# Patient Record
Sex: Male | Born: 1990 | Race: White | Hispanic: No | Marital: Married | Smoking: Never smoker
Health system: Southern US, Community
[De-identification: ages and names within clinical notes are randomized; demographics above are authoritative.]

---
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
## 2011-05-10 ENCOUNTER — Emergency Department (INDEPENDENT_AMBULATORY_CARE_PROVIDER_SITE_OTHER): Payer: BC Managed Care – PPO

## 2011-05-10 ENCOUNTER — Encounter (HOSPITAL_COMMUNITY): Payer: Self-pay | Admitting: *Deleted

## 2011-05-10 ENCOUNTER — Emergency Department (INDEPENDENT_AMBULATORY_CARE_PROVIDER_SITE_OTHER)
Admission: EM | Admit: 2011-05-10 | Discharge: 2011-05-10 | Disposition: A | Discharge status: Home / Self Care | Payer: BC Managed Care – PPO | Source: Home / Self Care | Attending: Emergency Medicine | Admitting: Emergency Medicine

## 2011-05-10 DIAGNOSIS — S40019A Contusion of unspecified shoulder, initial encounter: Secondary | ICD-10-CM

## 2011-05-10 MED ORDER — TRAMADOL HCL 50 MG PO TABS: 50.0000 mg | ORAL_TABLET | Freq: Four times a day (QID) | ORAL | Status: AC | PRN

## 2011-05-10 MED ORDER — MELOXICAM 7.5 MG PO TABS: 7.5000 mg | ORAL_TABLET | Freq: Every day | ORAL | Status: AC

## 2011-05-10 NOTE — ED Provider Notes (Addendum)
History     CSN: [id number]  Arrival date & time 05/10/11  1151   First MD Initiated Contact with Patient 05/10/11 1409      Chief Complaint  Patient presents with  . Shoulder Pain    (Consider location/radiation/quality/duration/timing/severity/associated sxs/prior treatment) HPI Comments: Anh sustained an injury to his right shoulder when he was playful pull and collided with another player and this is yard football fell down, since then been complaining of discomfort (points towards right acromioclavicular joint) pain is exacerbated with movement and direct palpation of the area gets worse when he tries to raise his arm points again to acromioclavicular joint.  Denies any numbness or weakness. Has been trying to use over-the-counter Motrin for discomfort.  The history is provided by the patient and the spouse.    History reviewed. No pertinent past medical history.  History reviewed. No pertinent past surgical history.  History reviewed. No pertinent family history.  History  Substance Use Topics  . Smoking status: Not on file  . Smokeless tobacco: Not on file  . Alcohol Use: Not on file      Review of Systems  Constitutional: Negative for fever.  HENT: Negative for neck pain and neck stiffness.   Musculoskeletal: Positive for arthralgias.  Neurological: Negative for weakness and numbness.    Allergies  Review of patient's allergies indicates no known allergies.  Home Medications  No current outpatient prescriptions on file.  BP 144/82  Pulse 68  Temp(Src) 97.8 F (36.6 C) (Oral)  Resp 16  SpO2 100%  Physical Exam  Constitutional: He appears well-developed and well-nourished. No distress.  Neck: Neck supple. No JVD present.  Pulmonary/Chest: No respiratory distress.  Musculoskeletal:       Right shoulder: He exhibits tenderness, bony tenderness and pain. He exhibits normal range of motion, no swelling, no crepitus and no deformity.        Arms: Lymphadenopathy:    He has no cervical adenopathy.  Neurological: He is alert.  Skin: Skin is warm. No rash noted. No erythema.    ED Course  Procedures (including critical care time)  Labs Reviewed - No data to display Dg Shoulder Right  05/10/2011  *RADIOLOGY REPORT*  Clinical Data: Shoulder pain  RIGHT SHOULDER - 2+ VIEW  Comparison: None.  Findings: Three views of the right shoulder submitted.  No acute fracture or subluxation.  No radiopaque foreign body. The glenohumeral joint and acromioclavicular joint are preserved.  IMPRESSION: No acute fracture or subluxation.  Original Report Authenticated By: Natasha Mead, M.D.     No diagnosis found.    MDM  Lorayne Marek, MD 05/10/11 1443  Jimmie Molly, MD 05/10/11 1452

## 2011-05-10 NOTE — ED Notes (Signed)
Fell onto right shoulder 2 wks ago while playing football, co pain in shoulder and some into right upper arm, working makes pain worse, painful when raising arm

## 2014-08-12 ENCOUNTER — Emergency Department (INDEPENDENT_AMBULATORY_CARE_PROVIDER_SITE_OTHER)
Admission: EM | Admit: 2014-08-12 | Discharge: 2014-08-12 | Disposition: A | Discharge status: Home / Self Care | Payer: Managed Care, Other (non HMO) | Source: Home / Self Care | Attending: Emergency Medicine | Admitting: Emergency Medicine

## 2014-08-12 DIAGNOSIS — S29012A Strain of muscle and tendon of back wall of thorax, initial encounter: Secondary | ICD-10-CM

## 2014-08-12 MED ORDER — TRAMADOL HCL 50 MG PO TABS: 50.0000 mg | ORAL_TABLET | Freq: Four times a day (QID) | ORAL | Status: DC | PRN

## 2014-08-12 MED ORDER — CYCLOBENZAPRINE HCL 10 MG PO TABS
10.0000 mg | ORAL_TABLET | Freq: Three times a day (TID) | ORAL | Status: DC | PRN
Start: 2014-08-12 — End: 2022-05-30

## 2014-08-12 MED ORDER — IBUPROFEN 800 MG PO TABS: 800.0000 mg | ORAL_TABLET | Freq: Three times a day (TID) | ORAL | Status: DC

## 2014-08-12 NOTE — ED Provider Notes (Signed)
CSN: [id number]     Arrival date & time 08/12/14  1515 History   First MD Initiated Contact with Patient 08/12/14 1628     No chief complaint on file. CC: back pain  (Consider location/radiation/quality/duration/timing/severity/associated sxs/prior Treatment) HPI He is a 24 year old man here for evaluation of mid back pain. He states this started yesterday after helping his wife in the yard. 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 It is in his mid back, worse on the right side. It does not radiate. No numbness, tingling, weakness in any extremity.  He has taken Aleve without much improvement. Pain is worse with forward flexion and rotation.  No past medical history on file. No past surgical history on file. No family history on file. History  Substance Use Topics  . Smoking status: Not on file  . Smokeless tobacco: Not on file  . Alcohol Use: Not on file    Review of Systems As in history of present illness Allergies  Review of patient's allergies indicates no known allergies.  Home Medications   Prior to Admission medications   Medication Sig Start Date End Date Taking? Authorizing Provider  cyclobenzaprine (FLEXERIL) 10 MG tablet Take 1 tablet (10 mg total) by mouth 3 (three) times daily as needed for muscle spasms. 08/12/14   Charm RingsErin J Claborn Janusz, MD  ibuprofen (ADVIL,MOTRIN) 800 MG tablet Take 1 tablet (800 mg total) by mouth 3 (three) times daily. 08/12/14   Charm RingsErin J Damarkus Balis, MD  traMADol (ULTRAM) 50 MG tablet Take 1 tablet (50 mg total) by mouth every 6 (six) hours as needed. 08/12/14   Charm RingsErin J Kemari Narez, MD   BP 135/80 mmHg  Pulse 66  Temp(Src) 98 F (36.7 C) (Oral)  Resp 16  SpO2 100% Physical Exam  Constitutional: He is oriented to person, place, and time. He appears well-developed and well-nourished. He appears distressed (sitting stiffly in chair).  Cardiovascular: Normal rate.    Pulmonary/Chest: Effort normal.  Musculoskeletal:  Back: No erythema or edema. No vertebral tenderness or step-offs. He has spasm in the right thoracic paraspinous muscles.  Neurological: He is alert and oriented to person, place, and time.    ED Course  Procedures (including critical care time) Labs Review Labs Reviewed - No data to display  Imaging Review No results found.   MDM   1. Strain of mid-back, initial encounter    Will treat with ice/heat, ibuprofen, Flexeril. Tramadol prescription for severe pain. Work note for [employer] duty provided. Follow-up as needed.    Charm RingsErin J Jumanah Hynson, MD 08/12/14 1710

## 2014-08-12 NOTE — Discharge Instructions (Signed)
You strained one of the muscles in your back. Take ibuprofen 800 mg 3 times a day for the next 3-5 days. Take Flexeril at bedtime for spasm. You can take this medicine up to 3 times a day, but will make you sleepy. Use the tramadol every 6 hours as needed for severe pain. Do not drive while taking this medicine. Alternate ice and heat to your back. Starting Monday, use lotion or essential oils for massage. Follow-up if no improvement in 1 week.

## 2016-03-16 ENCOUNTER — Encounter (HOSPITAL_COMMUNITY): Payer: Self-pay | Admitting: *Deleted

## 2016-03-16 ENCOUNTER — Ambulatory Visit (HOSPITAL_COMMUNITY)
Admission: EM | Admit: 2016-03-16 | Discharge: 2016-03-16 | Disposition: A | Discharge status: Home / Self Care | Payer: BLUE CROSS/BLUE SHIELD | Attending: Family Medicine | Admitting: Family Medicine

## 2016-03-16 DIAGNOSIS — Z87891 Personal history of nicotine dependence: Secondary | ICD-10-CM | POA: Insufficient documentation

## 2016-03-16 DIAGNOSIS — Z202 Contact with and (suspected) exposure to infections with a predominantly sexual mode of transmission: Secondary | ICD-10-CM | POA: Insufficient documentation

## 2016-03-16 MED ORDER — LIDOCAINE HCL (PF) 1 % IJ SOLN
INTRAMUSCULAR | Status: AC
  Filled 2016-03-16: qty 2

## 2016-03-16 MED ORDER — AZITHROMYCIN 250 MG PO TABS
ORAL_TABLET | ORAL | Status: AC
  Filled 2016-03-16: qty 4

## 2016-03-16 MED ORDER — AZITHROMYCIN 250 MG PO TABS
1000.0000 mg | ORAL_TABLET | Freq: Once | ORAL | Status: AC
  Administered 2016-03-16: 1000 mg via ORAL

## 2016-03-16 MED ORDER — CEFTRIAXONE SODIUM 250 MG IJ SOLR
INTRAMUSCULAR | Status: AC
  Filled 2016-03-16: qty 250

## 2016-03-16 MED ORDER — CEFTRIAXONE SODIUM 250 MG IJ SOLR
250.0000 mg | Freq: Once | INTRAMUSCULAR | Status: AC
  Administered 2016-03-16: 250 mg via INTRAMUSCULAR

## 2016-03-16 NOTE — ED Provider Notes (Signed)
[facility]    CSN: [id number] Arrival date & time: 03/16/16  1703     History   Chief Complaint Chief Complaint  Patient presents with  . Exposure to STD    HPI Andrew Cervantes is a [age] y.o. male.   The history is provided by the patient.  Exposure to STD  This is a new problem.  He had sex with someone he knew 6 days ago, later he was informed that his partner has STDs. He denies any penile discharge, no pain, no dysuria, no fever. No previous hx of STD.  History reviewed. No pertinent past medical history.  There are no active problems to display for this patient.   History reviewed. No pertinent surgical history.     Home Medications    Prior to Admission medications   Medication Sig Start Date End Date Taking? Authorizing Provider  cyclobenzaprine (FLEXERIL) 10 MG tablet Take 1 tablet (10 mg total) by mouth 3 (three) times daily as needed for muscle spasms. 08/12/14   Charm RingsErin J Honig, MD  ibuprofen (ADVIL,MOTRIN) 800 MG tablet Take 1 tablet (800 mg total) by mouth 3 (three) times daily. 08/12/14   Charm RingsErin J Honig, MD  traMADol (ULTRAM) 50 MG tablet Take 1 tablet (50 mg total) by mouth every 6 (six) hours as needed. 08/12/14   Charm RingsErin J Honig, MD    Family History History reviewed. No pertinent family history.  Social History Social History  Substance Use Topics  . Smoking status: Former Games developermoker  . Smokeless tobacco: Not on file  . Alcohol use No     Allergies   Patient has no known allergies.   Review of Systems Review of Systems  Constitutional: Negative.   Eyes: Negative.   Respiratory: Negative.   Cardiovascular: Negative.   Gastrointestinal: Negative.   Genitourinary: Negative.   All other systems reviewed and are negative.    Physical Exam Triage Vital Signs ED Triage Vitals [03/16/16 1717]  Enc Vitals Group     BP 135/90     Pulse Rate 71     Resp 16     Temp 97.3 F (36.3 C)     Temp Source Oral     SpO2 100 %     Weight    Height      Head Circumference      Peak Flow      Pain Score      Pain Loc      Pain Edu?      Excl. in GC?    No data found.   Updated Vital Signs BP 135/90   Pulse 71   Temp 97.3 F (36.3 C) (Oral)   Resp 16   SpO2 100%   Visual Acuity Right Eye Distance:   Left Eye Distance:   Bilateral Distance:    Right Eye Near:   Left Eye Near:    Bilateral Near:     Physical Exam  Constitutional: He appears well-developed. No distress.  Cardiovascular: Normal rate, regular rhythm and normal heart sounds.   No murmur heard. Pulmonary/Chest: Effort normal and breath sounds normal. No respiratory distress. He has no wheezes.  Abdominal: Soft. Bowel sounds are normal. He exhibits no distension and no mass. There is no tenderness.  Genitourinary:  Genitourinary Comments: Deferred  Nursing note and vitals reviewed.    UC Treatments / Results  Labs (all labs ordered are listed, but only abnormal results are displayed) Labs Reviewed - No data to display  EKG  EKG Interpretation None       Radiology No results found.  Procedures Procedures (including critical care time)  Medications Ordered in UC Medications - No data to display   Initial Impression / Assessment and Plan / UC Course  I have reviewed the triage vital signs and the nursing notes.  Pertinent labs & imaging results that were available during my care of the patient were reviewed by me and considered in my medical decision making (see chart for details).  Clinical Course as of Mar 16 1728  Wynelle LinkSun Mar 16, 2016  1726 Hx of STD exposure. Patient currently asymptomatic. Will treat based on Hx with Zithromax and Ceftriaxone. Urine cytology obtained for GC/Chlamydia. I will contact him with result if positive. STD counseling done. F/U as needed.  [KE]    Clinical Course User Index [KE] Doreene ElandKehinde T Diamonds Lippard, MD     Final Clinical Impressions(s) / UC Diagnoses   Final diagnoses:  None  STD  exposure    New Prescriptions New Prescriptions   No medications on file     Doreene ElandKehinde T Elajah Kunsman, MD 03/16/16 1735

## 2016-03-16 NOTE — Discharge Instructions (Signed)
It was nice meeting you today. We did some STD screening and we treated you for gonorrhea and chlamydia based on your history. We will contact you with your result. Please use condom regularly. If positive test result, please also inform your partner to get treated.

## 2016-03-16 NOTE — ED Triage Notes (Signed)
Reports having intercourse with partner that was exposed to gonorrhea from another partner.  Pt denies any sxs.

## 2016-03-16 NOTE — ED Notes (Signed)
Verified phone number 

## 2016-03-17 LAB — URINE CYTOLOGY ANCILLARY ONLY
CHLAMYDIA, DNA PROBE: NEGATIVE
NEISSERIA GONORRHEA: NEGATIVE
Trichomonas: NEGATIVE

## 2016-03-17 LAB — RPR: RPR: NONREACTIVE

## 2016-03-17 LAB — HIV ANTIBODY (ROUTINE TESTING W REFLEX): HIV SCREEN 4TH GENERATION: NONREACTIVE

## 2016-03-18 ENCOUNTER — Telehealth: Payer: Self-pay | Admitting: Family Medicine

## 2016-03-18 NOTE — Telephone Encounter (Signed)
I spoke with patient directly. All test results were negative.

## 2017-07-20 ENCOUNTER — Ambulatory Visit: Payer: Self-pay | Admitting: Family Medicine

## 2017-07-20 VITALS — BP 110/76 | HR 88 | Temp 98.9°F | Resp 20 | Wt 181.8 lb

## 2017-07-20 DIAGNOSIS — H6592 Unspecified nonsuppurative otitis media, left ear: Secondary | ICD-10-CM

## 2017-07-20 DIAGNOSIS — H7292 Unspecified perforation of tympanic membrane, left ear: Principal | ICD-10-CM

## 2017-07-20 MED ORDER — AMOXICILLIN-POT CLAVULANATE 875-125 MG PO TABS: 1.0000 | ORAL_TABLET | Freq: Two times a day (BID) | ORAL | 0 refills | Status: DC

## 2017-07-20 NOTE — Patient Instructions (Addendum)
PLAN> F/U with PCP for evaluation  Eardrum Rupture, Adult An eardrum rupture is a hole (perforation) in the eardrum. The eardrum is a thin, round tissue inside of the ear that separates the ear canal from the middle ear. The eardrum is also called the tympanic membrane. It transfers sound vibrations through small bones in the middle ear to the hearing nerve in the inner ear. It also protects the middle ear from germs. An eardrum rupture can cause pain and hearing loss. What are the causes? This condition may be caused by:  An infection.  A sudden injury, such as from: ? Inserting a thin, sharp object into the ear. ? A hit to the side of the head, especially by an open hand. ? Falling onto water or a flat surface. ? A rapid change in pressure, such as from flying or scuba diving. ? A sudden increase in pressure against the eardrum, such as from an explosion or a very loud noise.  Inserting a cotton-tipped swab in the ear.  A long-term eustachian tube disorder. Eustachian tubes are parts of the body that connect each middle ear space to the back of the nose.  A medical procedure or surgery, such as a procedure to remove wax from the ear canal.  Removing a man-made pressure equalization tube(PE tube) that was placed through the eardrum.  Having a PE tube fall out.  What increases the risk? You are more likely to develop this condition if:  You have had PE tubes inserted in your ears.  You have an ear infection.  You play sports that: ? Involve balls or contact with other players. ? Take place in water, such as diving, scuba diving, or waterskiing.  What are the signs or symptoms? Symptoms of this condition include:  Sudden pain at the time of the injury.  Ear pain that suddenly improves.  Ringing in the ear after the injury.  Drainage from the ear. The drainage may be clear, cloudy or pus-like, or bloody.  Hearing loss.  Dizziness.  How is this diagnosed? This  condition is diagnosed based on your symptoms and medical history as well as a physical exam. Your health care provider can usually see a perforation using an ear scope (otoscope). You may have tests, such as:  A hearing test (audiogram) to check for hearing loss.  A test in which a sample of ear drainage is tested for infection (culture).  How is this treated? An eardrum typically heals on its own within a few weeks. If your eardrum does not heal, your health care provider may recommend a procedure to place a patch over your eardrum or surgery to repair your eardrum. Your health care provider may also prescribe antibiotic medicines to help prevent infection. If the ear heals completely, any hearing loss should be temporary. Follow these instructions at home:  Keep your ear dry. This is very important. Follow instructions from your health care provider about how to keep your ear dry. You may need to wear waterproof earplugs when bathing and swimming.  Take over-the-counter and prescription medicines only as told by your health care provider.  Return to sports and activities as told by your health care provider. Ask your health care provider what activities are safe for you.  Wear headgear with ear protection when you play sports in which ear injuries are common.  If directed, apply heat to your affected ear as often as told by your health care provider. Use the heat source that your  health care provider recommends, such as a moist heat pack or a heating pad. This will help to relieve pain. ? Place a towel between your skin and the heat source. ? Leave the heat on for 20-30 minutes. ? Remove the heat if your skin turns bright red. This is especially important if you are unable to feel pain, heat, or cold. You may have a greater risk of getting burned.  Keep all follow-up visits as told by your health care provider. This is important.  Talk to your health care provider before traveling by  plane. Contact a health care provider if:  You have mucus or blood draining from your ear.  You have a fever.  You have ear pain.  You have hearing loss, dizziness, or ringing in your ear. Get help right away if:  You have sudden hearing loss.  You are very dizzy.  You have severe ear pain.  Your face feels weak or becomes paralyzed. Summary  An eardrum rupture is a hole (perforation) in the eardrum that can cause pain and hearing loss. It is usually caused by a sudden injury to the ear.  The eardrum will likely heal on its own within a few weeks. In some cases, surgery may be necessary.  After the injury, follow instructions from your health care provider about how to keep your ear dry as it heals. This information is not intended to replace advice given to you by your health care provider. Make sure you discuss any questions you have with your health care provider. Document Released: 03/14/2000 Document Revised: 05/23/2016 Document Reviewed: 05/23/2016 Elsevier Interactive Patient Education  [employer].

## 2017-07-20 NOTE — Progress Notes (Signed)
Andrew Cervantes is a 27 y.o. male who presents with left ear pain that has persisted the last week then abruptly stopped last night and began to express bloody drainage from the ear.  Review of Systems  Constitutional: Negative.   HENT: Positive for ear discharge and ear pain.   Eyes: Negative.   Respiratory: Negative.   Cardiovascular: Negative.   Gastrointestinal: Negative.   Genitourinary: Negative.   Musculoskeletal: Negative.   Skin: Negative.   Neurological: Negative.   Endo/Heme/Allergies: Negative.   Psychiatric/Behavioral: Negative.     O: Vitals:   07/20/17 1048  BP: 110/76  Pulse: 88  Resp: 20  Temp: 98.9 F (37.2 C)  SpO2: 95%   Physical Exam  Constitutional: He appears well-developed and well-nourished.  HENT:  Right Ear: Hearing, tympanic membrane, external ear and ear canal normal.  Left Ear: Hearing normal. There is drainage. Tympanic membrane is perforated and erythematous.  Nose: Rhinorrhea present.  Mouth/Throat: Uvula is midline, oropharynx is clear and moist and mucous membranes are normal.  Neck: Normal range of motion. Neck supple.  Cardiovascular: Normal rate.  Pulmonary/Chest: Effort normal and breath sounds normal.  Abdominal: Soft. Bowel sounds are normal.  Musculoskeletal: Normal range of motion.  Lymphadenopathy:    He has no cervical adenopathy.  Neurological: He is alert.    A: 1. Otitis media, serous, tm rupture, left    P: Exam findings, diagnosis etiology and typical treatment, medication use and indications reviewed with patient who agrees with POC and verbalized understanding.  1. Otitis media, serous, tm rupture, left - amoxicillin-clavulanate (AUGMENTIN) 875-125 MG tablet; Take 1 tablet by mouth 2 (two) times daily.

## 2017-07-28 ENCOUNTER — Telehealth: Payer: Self-pay

## 2017-07-28 NOTE — Telephone Encounter (Signed)
Tried to call and follow up with pt but pt phone kept disconnecting.

## 2017-10-29 ENCOUNTER — Emergency Department (HOSPITAL_COMMUNITY)
Admission: EM | Admit: 2017-10-29 | Discharge: 2017-10-29 | Disposition: A | Discharge status: Home / Self Care | Payer: No Typology Code available for payment source | Attending: Emergency Medicine | Admitting: Emergency Medicine

## 2017-10-29 ENCOUNTER — Emergency Department (HOSPITAL_COMMUNITY): Payer: No Typology Code available for payment source

## 2017-10-29 ENCOUNTER — Other Ambulatory Visit: Payer: Self-pay

## 2017-10-29 ENCOUNTER — Encounter (HOSPITAL_COMMUNITY): Payer: Self-pay | Admitting: Emergency Medicine

## 2017-10-29 DIAGNOSIS — S01112A Laceration without foreign body of left eyelid and periocular area, initial encounter: Secondary | ICD-10-CM | POA: Diagnosis not present

## 2017-10-29 DIAGNOSIS — Y939 Activity, unspecified: Secondary | ICD-10-CM | POA: Insufficient documentation

## 2017-10-29 DIAGNOSIS — Y999 Unspecified external cause status: Secondary | ICD-10-CM | POA: Diagnosis not present

## 2017-10-29 DIAGNOSIS — Y929 Unspecified place or not applicable: Secondary | ICD-10-CM | POA: Insufficient documentation

## 2017-10-29 DIAGNOSIS — S0990XA Unspecified injury of head, initial encounter: Secondary | ICD-10-CM | POA: Diagnosis not present

## 2017-10-29 DIAGNOSIS — T1490XA Injury, unspecified, initial encounter: Secondary | ICD-10-CM

## 2017-10-29 DIAGNOSIS — S2231XA Fracture of one rib, right side, initial encounter for closed fracture: Secondary | ICD-10-CM | POA: Diagnosis not present

## 2017-10-29 DIAGNOSIS — S0181XA Laceration without foreign body of other part of head, initial encounter: Secondary | ICD-10-CM | POA: Insufficient documentation

## 2017-10-29 DIAGNOSIS — S12390A Other displaced fracture of fourth cervical vertebra, initial encounter for closed fracture: Secondary | ICD-10-CM | POA: Diagnosis not present

## 2017-10-29 DIAGNOSIS — S199XXA Unspecified injury of neck, initial encounter: Secondary | ICD-10-CM | POA: Diagnosis present

## 2017-10-29 DIAGNOSIS — S12490A Other displaced fracture of fifth cervical vertebra, initial encounter for closed fracture: Secondary | ICD-10-CM | POA: Insufficient documentation

## 2017-10-29 DIAGNOSIS — S3991XA Unspecified injury of abdomen, initial encounter: Secondary | ICD-10-CM | POA: Diagnosis not present

## 2017-10-29 LAB — URINALYSIS, ROUTINE W REFLEX MICROSCOPIC
BILIRUBIN URINE: NEGATIVE
Bacteria, UA: NONE SEEN
Glucose, UA: NEGATIVE mg/dL
Ketones, ur: 20 mg/dL — AB
Leukocytes, UA: NEGATIVE
NITRITE: NEGATIVE
Protein, ur: NEGATIVE mg/dL
pH: 5 (ref 5.0–8.0)

## 2017-10-29 LAB — CBC
HEMATOCRIT: 46.2 % (ref 39.0–52.0)
Hemoglobin: 15.3 g/dL (ref 13.0–17.0)
MCH: 30.3 pg (ref 26.0–34.0)
MCHC: 33.1 g/dL (ref 30.0–36.0)
MCV: 91.5 fL (ref 78.0–100.0)
PLATELETS: 189 10*3/uL (ref 150–400)
RBC: 5.05 MIL/uL (ref 4.22–5.81)
RDW: 12 % (ref 11.5–15.5)
WBC: 8.5 10*3/uL (ref 4.0–10.5)

## 2017-10-29 LAB — I-STAT CHEM 8, ED
BUN: 9 mg/dL (ref 6–20)
CREATININE: 1.3 mg/dL — AB (ref 0.61–1.24)
Calcium, Ion: 1.17 mmol/L (ref 1.15–1.40)
Chloride: 102 mmol/L (ref 98–111)
Glucose, Bld: 135 mg/dL — ABNORMAL HIGH (ref 70–99)
HCT: 46 % (ref 39.0–52.0)
HEMOGLOBIN: 15.6 g/dL (ref 13.0–17.0)
POTASSIUM: 3.3 mmol/L — AB (ref 3.5–5.1)
Sodium: 140 mmol/L (ref 135–145)
TCO2: 24 mmol/L (ref 22–32)

## 2017-10-29 LAB — I-STAT CG4 LACTIC ACID, ED: LACTIC ACID, VENOUS: 1.84 mmol/L (ref 0.5–1.9)

## 2017-10-29 LAB — PROTIME-INR
INR: 1.02
PROTHROMBIN TIME: 13.3 s (ref 11.4–15.2)

## 2017-10-29 LAB — COMPREHENSIVE METABOLIC PANEL
ALT: 27 U/L (ref 0–44)
AST: 26 U/L (ref 15–41)
Albumin: 4.3 g/dL (ref 3.5–5.0)
Alkaline Phosphatase: 72 U/L (ref 38–126)
Anion gap: 8 (ref 5–15)
BILIRUBIN TOTAL: 1 mg/dL (ref 0.3–1.2)
BUN: 8 mg/dL (ref 6–20)
CHLORIDE: 106 mmol/L (ref 98–111)
CO2: 26 mmol/L (ref 22–32)
CREATININE: 1.28 mg/dL — AB (ref 0.61–1.24)
Calcium: 9.2 mg/dL (ref 8.9–10.3)
Glucose, Bld: 142 mg/dL — ABNORMAL HIGH (ref 70–99)
Potassium: 3.3 mmol/L — ABNORMAL LOW (ref 3.5–5.1)
Sodium: 140 mmol/L (ref 135–145)
Total Protein: 6.8 g/dL (ref 6.5–8.1)

## 2017-10-29 LAB — ETHANOL: Alcohol, Ethyl (B): <10 mg/dL (ref <10)

## 2017-10-29 MED ORDER — LIDOCAINE-EPINEPHRINE (PF) 2 %-1:200000 IJ SOLN
20.0000 mL | Freq: Once | INTRAMUSCULAR | Status: AC
  Administered 2017-10-29: 20 mL via INTRADERMAL
  Filled 2017-10-29: qty 20

## 2017-10-29 MED ORDER — ONDANSETRON HCL 4 MG/2ML IJ SOLN
4.0000 mg | Freq: Once | INTRAMUSCULAR | Status: AC
  Administered 2017-10-29: 4 mg via INTRAVENOUS
  Filled 2017-10-29: qty 2

## 2017-10-29 MED ORDER — OXYCODONE HCL 5 MG PO TABS
10.0000 mg | ORAL_TABLET | Freq: Once | ORAL | Status: AC
  Administered 2017-10-29: 10 mg via ORAL
  Filled 2017-10-29: qty 2

## 2017-10-29 MED ORDER — MORPHINE SULFATE 15 MG PO TABS: 15.0000 mg | ORAL_TABLET | ORAL | 0 refills | Status: DC | PRN

## 2017-10-29 MED ORDER — KETOROLAC TROMETHAMINE 15 MG/ML IJ SOLN: 15.0000 mg | Freq: Once | INTRAMUSCULAR | Status: DC

## 2017-10-29 MED ORDER — IOPAMIDOL (ISOVUE-370) INJECTION 76%
INTRAVENOUS | Status: AC
  Filled 2017-10-29: qty 100

## 2017-10-29 MED ORDER — MORPHINE SULFATE (PF) 4 MG/ML IV SOLN
4.0000 mg | Freq: Once | INTRAVENOUS | Status: AC
  Administered 2017-10-29: 4 mg via INTRAVENOUS
  Filled 2017-10-29: qty 1

## 2017-10-29 MED ORDER — KETOROLAC TROMETHAMINE 60 MG/2ML IM SOLN
15.0000 mg | Freq: Once | INTRAMUSCULAR | Status: DC
  Administered 2017-10-29: 15 mg via INTRAMUSCULAR
  Filled 2017-10-29: qty 2

## 2017-10-29 MED ORDER — IOPAMIDOL (ISOVUE-370) INJECTION 76%
INTRAVENOUS | Status: AC
  Administered 2017-10-29: 12:00:00
  Filled 2017-10-29: qty 100

## 2017-10-29 MED ORDER — ACETAMINOPHEN 500 MG PO TABS
1000.0000 mg | ORAL_TABLET | Freq: Once | ORAL | Status: AC
  Administered 2017-10-29: 1000 mg via ORAL
  Filled 2017-10-29: qty 2

## 2017-10-29 NOTE — ED Provider Notes (Signed)
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 sx which are most concerning (e.g., weakness, numbness) that necessitate immediate return. Medications administered to the patient during their visit and any new prescriptions provided to the patient are listed below.  Medications given during this visit Medications  iopamidol (ISOVUE-370) 76 % injection (  Canceled Entry 10/29/17 1100)  ketorolac (TORADOL) 15 MG/ML injection 15 mg (has no administration in time range)  iopamidol (ISOVUE-370) 76 % injection (  Contrast Given 10/29/17 1206)  morphine 4 MG/ML injection 4 mg (4 mg Intravenous Given 10/29/17 1158)  ondansetron (ZOFRAN) injection 4 mg (4 mg Intravenous Given 10/29/17 1155)  lidocaine-EPINEPHrine (XYLOCAINE W/EPI) 2 %-1:200000 (PF) injection 20 mL (20 mLs Intradermal Given 10/29/17 1312)  morphine 4 MG/ML injection 4 mg (4 mg Intravenous Given 10/29/17 1357)  ondansetron (ZOFRAN) injection 4 mg (4 mg Intravenous Given 10/29/17 1356)  acetaminophen (TYLENOL) tablet 1,000 mg (1,000 mg Oral Given 10/29/17 1531)  oxyCODONE (Oxy IR/ROXICODONE) immediate release tablet 10 mg (10 mg Oral Given 10/29/17 1532)     The patient appears reasonably screen and/or stabilized for discharge and I doubt any other medical  condition or other [facility] requiring further screening, evaluation, or treatment in the ED at this time prior to discharge.    Final Clinical Impressions(s) / ED Diagnoses   Final diagnoses:  Other closed displaced fracture of fourth cervical vertebra, initial encounter (HCC)  Other closed displaced fracture of fifth cervical vertebra, initial encounter (HCC)  Closed fracture of one rib of right side, initial encounter    ED Discharge Orders  Ordered    morphine (MSIR) 15 MG tablet  Every 4 hours PRN     10/29/17 1515       Melene Plan, DO 10/29/17 1534

## 2017-10-29 NOTE — ED Notes (Signed)
Pt returned from CT- has full sensation and movement to all extremeties
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
## 2017-10-29 NOTE — Discharge Instructions (Addendum)

## 2017-10-29 NOTE — ED Notes (Signed)
Pt continues to have full sensation to all extremeties

## 2017-10-29 NOTE — ED Triage Notes (Signed)
See trauma chart

## 2017-10-30 ENCOUNTER — Encounter (HOSPITAL_COMMUNITY): Payer: Self-pay | Admitting: *Deleted

## 2017-10-30 LAB — CDS SEROLOGY

## 2017-10-31 LAB — SAMPLE TO BLOOD BANK

## 2018-05-09 ENCOUNTER — Ambulatory Visit: Payer: Self-pay | Admitting: Family Medicine

## 2018-05-09 ENCOUNTER — Encounter: Payer: Self-pay | Admitting: Family Medicine

## 2018-05-09 VITALS — BP 120/88 | HR 98 | Temp 98.4°F | Resp 18 | Wt 175.8 lb

## 2018-05-09 DIAGNOSIS — J019 Acute sinusitis, unspecified: Secondary | ICD-10-CM

## 2018-05-09 DIAGNOSIS — R05 Cough: Secondary | ICD-10-CM

## 2018-05-09 DIAGNOSIS — R059 Cough, unspecified: Secondary | ICD-10-CM

## 2018-05-09 MED ORDER — AMOXICILLIN-POT CLAVULANATE 875-125 MG PO TABS: 1.0000 | ORAL_TABLET | Freq: Two times a day (BID) | ORAL | 0 refills | Status: DC

## 2018-05-09 MED ORDER — PSEUDOEPH-BROMPHEN-DM 30-2-10 MG/5ML PO SYRP: 10.0000 mL | ORAL_SOLUTION | Freq: Three times a day (TID) | ORAL | 0 refills | Status: DC | PRN

## 2018-05-09 MED ORDER — AZELASTINE HCL 0.1 % NA SOLN: 1.0000 | Freq: Two times a day (BID) | NASAL | 0 refills | Status: DC

## 2018-05-09 NOTE — Patient Instructions (Signed)
Sinusitis, Adult  Sinusitis is inflammation of your sinuses. Sinuses are hollow spaces in the bones around your face. Your sinuses are located:   Around your eyes.   In the middle of your forehead.   Behind your nose.   In your cheekbones.  Mucus normally drains out of your sinuses. When your nasal tissues become inflamed or swollen, mucus can become trapped or blocked. This allows bacteria, viruses, and fungi to grow, which leads to infection. Most infections of the sinuses are caused by a virus.  Sinusitis can develop quickly. It can last for up to 4 weeks (acute) or for more than 12 weeks (chronic). Sinusitis often develops after a cold.  What are the causes?  This condition is caused by anything that creates swelling in the sinuses or stops mucus from draining. This includes:   Allergies.   Asthma.   Infection from bacteria or viruses.   Deformities or blockages in your nose or sinuses.   Abnormal growths in the nose (nasal polyps).   Pollutants, such as chemicals or irritants in the air.   Infection from fungi (rare).  What increases the risk?  You are more likely to develop this condition if you:   Have a weak body defense system (immune system).   Do a lot of swimming or diving.   Overuse nasal sprays.   Smoke.  What are the signs or symptoms?  The main symptoms of this condition are pain and a feeling of pressure around the affected sinuses. Other symptoms include:   Stuffy nose or congestion.   Thick drainage from your nose.   Swelling and warmth over the affected sinuses.   Headache.   Upper toothache.   A cough that may get worse at night.   Extra mucus that collects in the throat or the back of the nose (postnasal drip).   Decreased sense of smell and taste.   Fatigue.   A fever.   Sore throat.   Bad breath.  How is this diagnosed?  This condition is diagnosed based on:   Your symptoms.   Your medical history.   A physical exam.   Tests to find out if your condition is  acute or chronic. This may include:  ? Checking your nose for nasal polyps.  ? Viewing your sinuses using a device that has a light (endoscope).  ? Testing for allergies or bacteria.  ? Imaging tests, such as an MRI or CT scan.  In rare cases, a bone biopsy may be done to rule out more serious types of fungal sinus disease.  How is this treated?  Treatment for sinusitis depends on the cause and whether your condition is chronic or acute.   If caused by a virus, your symptoms should go away on their own within 10 days. You may be given medicines to relieve symptoms. They include:  ? Medicines that shrink swollen nasal passages (topical intranasal decongestants).  ? Medicines that treat allergies (antihistamines).  ? A spray that eases inflammation of the nostrils (topical intranasal corticosteroids).  ? Rinses that help get rid of thick mucus in your nose (nasal saline washes).   If caused by bacteria, your health care provider may recommend waiting to see if your symptoms improve. Most bacterial infections will get better without antibiotic medicine. You may be given antibiotics if you have:  ? A severe infection.  ? A weak immune system.   If caused by narrow nasal passages or nasal polyps, you may need   to have surgery.  Follow these instructions at home:  Medicines   Take, use, or apply over-the-counter and prescription medicines only as told by your health care provider. These may include nasal sprays.   If you were prescribed an antibiotic medicine, take it as told by your health care provider. Do not stop taking the antibiotic even if you start to feel better.  Hydrate and humidify     Drink enough fluid to keep your urine pale yellow. Staying hydrated will help to thin your mucus.   Use a cool mist humidifier to keep the humidity level in your home above 50%.   Inhale steam for 10-15 minutes, 3-4 times a day, or as told by your health care provider. You can do this in the bathroom while a hot shower is  running.   Limit your exposure to cool or dry air.  Rest   Rest as much as possible.   Sleep with your head raised (elevated).   Make sure you get enough sleep each night.  General instructions     Apply a warm, moist washcloth to your face 3-4 times a day or as told by your health care provider. This will help with discomfort.   Wash your hands often with soap and water to reduce your exposure to germs. If soap and water are not available, use hand sanitizer.   Do not smoke. Avoid being around people who are smoking (secondhand smoke).   Keep all follow-up visits as told by your health care provider. This is important.  Contact a health care provider if:   You have a fever.   Your symptoms get worse.   Your symptoms do not improve within 10 days.  Get help right away if:   You have a severe headache.   You have persistent vomiting.   You have severe pain or swelling around your face or eyes.   You have vision problems.   You develop confusion.   Your neck is stiff.   You have trouble breathing.  Summary   Sinusitis is soreness and inflammation of your sinuses. Sinuses are hollow spaces in the bones around your face.   This condition is caused by nasal tissues that become inflamed or swollen. The swelling traps or blocks the flow of mucus. This allows bacteria, viruses, and fungi to grow, which leads to infection.   If you were prescribed an antibiotic medicine, take it as told by your health care provider. Do not stop taking the antibiotic even if you start to feel better.   Keep all follow-up visits as told by your health care provider. This is important.  This information is not intended to replace advice given to you by your health care provider. Make sure you discuss any questions you have with your health care provider.  Document Released: 03/17/2005 Document Revised: 08/17/2017 Document Reviewed: 08/17/2017  Elsevier Interactive Patient Education  2019 [employer]
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
## 2018-10-20 ENCOUNTER — Other Ambulatory Visit: Payer: Self-pay

## 2018-10-20 ENCOUNTER — Ambulatory Visit (HOSPITAL_COMMUNITY)
Admission: EM | Admit: 2018-10-20 | Discharge: 2018-10-20 | Disposition: A | Discharge status: Home / Self Care | Payer: Self-pay | Attending: Emergency Medicine | Admitting: Emergency Medicine

## 2018-10-20 ENCOUNTER — Encounter (HOSPITAL_COMMUNITY): Payer: Self-pay

## 2018-10-20 DIAGNOSIS — Z20828 Contact with and (suspected) exposure to other viral communicable diseases: Secondary | ICD-10-CM | POA: Insufficient documentation

## 2018-10-20 DIAGNOSIS — R1084 Generalized abdominal pain: Secondary | ICD-10-CM | POA: Insufficient documentation

## 2018-10-20 LAB — POCT URINALYSIS DIP (DEVICE)
Bilirubin Urine: NEGATIVE
Glucose, UA: NEGATIVE mg/dL
Hgb urine dipstick: NEGATIVE
Ketones, ur: NEGATIVE mg/dL
Leukocytes,Ua: NEGATIVE
Nitrite: NEGATIVE
Protein, ur: NEGATIVE mg/dL
Specific Gravity, Urine: 1.02 (ref 1.005–1.030)
Urobilinogen, UA: 1 mg/dL (ref 0.0–1.0)
pH: 7.5 (ref 5.0–8.0)

## 2018-10-20 MED ORDER — ALBENDAZOLE 200 MG PO TABS: 400.0000 mg | ORAL_TABLET | ORAL | 0 refills | Status: DC

## 2018-10-20 NOTE — ED Notes (Signed)
Sent patient to bathroom for urine and stool sample with instruction

## 2018-10-20 NOTE — Discharge Instructions (Addendum)
Take the medicine to treat for pinworms as directed: 2 tablets today on an empty stomach and then repeat in 2 weeks.    Your urine was normal today.    Your stool test are pending.  We will call you if anything comes back positive requiring additional treatment.    Go to the emergency department if your abdominal pain gets acutely worse.    Return here or go to the emergency department if you develop fever, chills, cough, shortness of breath, vomiting, difficulty with urination, back pain, penile discharge, testicular pain.    Establish yourself with a primary care provider such as the one listed.

## 2018-10-20 NOTE — ED Provider Notes (Signed)
[facility]    CSN: [id number] Arrival date & time: 10/20/18  1326     History   Chief Complaint Chief Complaint  Patient presents with   Abdominal Pain   Nausea    HPI Andrew Cervantes is a 28 y.o. male.   Patient presents with 1 year history of intermittent generalized abdominal pain and diarrhea.  He reports the pain is "sharp", occurs mostly at night or in the early morning, and is usually accompanied by diarrhea.  He is here today because he believes he saw a parasite in his stool which he describes as looking like "a piece of spaghetti".  He denies fever, chills, cough, shortness of breath, vomiting, dysuria, back pain, penile discharge, testicular pain.     The history is provided by the patient.    History reviewed. No pertinent past medical history.  There are no active problems to display for this patient.   History reviewed. No pertinent surgical history.     Home Medications    Prior to Admission medications   Medication Sig Start Date End Date Taking? Authorizing Provider  acetaminophen (TYLENOL) 500 MG tablet Take 500 mg by mouth every 6 (six) hours as needed.    [provider]  albendazole (ALBENZA) 200 MG tablet Take 2 tablets (400 mg total) by mouth as directed. Take 2 tablets today.  Then repeat in 2 weeks. 10/20/18   Mickie Bailate, Camren Lipsett H, NP  amoxicillin-clavulanate (AUGMENTIN) 875-125 MG tablet Take 1 tablet by mouth 2 (two) times daily. 05/09/18   Zachery DauerGraham, Elysa, NP  azelastine (ASTELIN) 0.1 % nasal spray Place 1 spray into both nostrils 2 (two) times daily. Use in each nostril as directed 05/09/18   Zachery DauerGraham, Elysa, NP  brompheniramine-pseudoephedrine-DM 30-2-10 MG/5ML syrup Take 10 mLs by mouth 3 (three) times daily as needed. 05/09/18   Zachery DauerGraham, Elysa, NP  cyclobenzaprine (FLEXERIL) 10 MG tablet Take 1 tablet (10 mg total) by mouth 3 (three) times daily as needed for muscle spasms. Patient not taking: Reported on 07/20/2017 08/12/14   Charm RingsHonig,  Erin J, MD  ibuprofen (ADVIL,MOTRIN) 800 MG tablet Take 1 tablet (800 mg total) by mouth 3 (three) times daily. 08/12/14   Charm RingsHonig, Erin J, MD  morphine (MSIR) 15 MG tablet Take 1 tablet (15 mg total) by mouth every 4 (four) hours as needed for severe pain. Patient not taking: Reported on 05/09/2018 10/29/17   Melene PlanFloyd, Dan, DO  traMADol (ULTRAM) 50 MG tablet Take 1 tablet (50 mg total) by mouth every 6 (six) hours as needed. Patient not taking: Reported on 07/20/2017 08/12/14   Charm RingsHonig, Erin J, MD    Family History History reviewed. No pertinent family history.  Social History Social History   Tobacco Use   Smoking status: Never Smoker   Smokeless tobacco: Never Used  Substance Use Topics   Alcohol use: Yes   Drug use: Not Currently    Types: Marijuana     Allergies   Patient has no known allergies.   Review of Systems Review of Systems  Constitutional: Negative for chills and fever.  HENT: Negative for ear pain and sore throat.   Eyes: Negative for pain and visual disturbance.  Respiratory: Negative for cough and shortness of breath.   Cardiovascular: Negative for chest pain and palpitations.  Gastrointestinal: Positive for abdominal pain and diarrhea. Negative for vomiting.  Genitourinary: Negative for discharge, dysuria, flank pain, hematuria, penile pain and testicular pain.  Musculoskeletal: Negative for arthralgias and back pain.  Skin:  Negative for color change and rash.  Neurological: Negative for seizures and syncope.  All other systems reviewed and are negative.    Physical Exam Triage Vital Signs ED Triage Vitals  Enc Vitals Group     BP 10/20/18 1430 105/76     Pulse Rate 10/20/18 1430 78     Resp 10/20/18 1430 18     Temp 10/20/18 1430 98.2 F (36.8 C)     Temp Source 10/20/18 1430 Oral     SpO2 10/20/18 1430 97 %     Weight 10/20/18 1427 175 lb (79.4 kg)     Height --      Head Circumference --      Peak Flow --      Pain Score 10/20/18 1427 3     Pain  Loc --      Pain Edu? --      Excl. in GC? --    No data found.  Updated Vital Signs BP 105/76 (BP Location: Right Arm)    Pulse 78    Temp 98.2 F (36.8 C) (Oral)    Resp 18    Wt 175 lb (79.4 kg)    SpO2 97%    BMI 23.73 kg/m   Visual Acuity Right Eye Distance:   Left Eye Distance:   Bilateral Distance:    Right Eye Near:   Left Eye Near:    Bilateral Near:     Physical Exam Vitals signs and nursing note reviewed.  Constitutional:      Appearance: He is well-developed.  HENT:     Head: Normocephalic and atraumatic.  Eyes:     Conjunctiva/sclera: Conjunctivae normal.  Neck:     Musculoskeletal: Neck supple.  Cardiovascular:     Rate and Rhythm: Normal rate and regular rhythm.  Pulmonary:     Effort: Pulmonary effort is normal. No respiratory distress.     Breath sounds: Normal breath sounds.  Abdominal:     Palpations: Abdomen is soft.     Tenderness: There is no abdominal tenderness. There is no right CVA tenderness, left CVA tenderness, guarding or rebound.  Skin:    General: Skin is warm and dry.  Neurological:     Mental Status: He is alert.      UC Treatments / Results  Labs (all labs ordered are listed, but only abnormal results are displayed) Labs Reviewed  GASTROINTESTINAL PANEL BY PCR, STOOL (REPLACES STOOL CULTURE)  NOVEL CORONAVIRUS, NAA (HOSPITAL ORDER, SEND-OUT TO REF LAB)  POCT URINALYSIS DIP (DEVICE)    EKG   Radiology No results found.  Procedures Procedures (including critical care time)  Medications Ordered in UC Medications - No data to display  Initial Impression / Assessment and Plan / UC Course  I have reviewed the triage vital signs and the nursing notes.  Pertinent labs & imaging results that were available during my care of the patient were reviewed by me and considered in my medical decision making (see chart for details).  Clinical Course as of Oct 19 1541  Wed Oct 20, 2018  1504 POCT Urinalysis, Dipstick [KT]    1510 POCT Urinalysis, Dipstick [KT]  1513 POCT Urinalysis, Dipstick [KT]  1522 POCT Urinalysis, Dipstick [KT]    Clinical Course User Index [KT] Mickie Bailate, Matteo Banke H, NP   Abdominal pain.  Urine negative.  Exam unremarkable.  Stool test pending; treating today with albendazole.  Discussed with patient that we will call him if his stool test indicate additional treatment is  needed.  Instructed patient to go to the emergency department if his abdominal pain gets acutely worse.  Discussed that he can return here or go to the emergency department if he develops fever, chills, cough, shortness of breath, vomiting, dysuria, back pain, penile discharge, testicular pain, or other concerning symptoms.  Instructed patient to establish himself with a primary care provider for additional evaluation as needed.   Final Clinical Impressions(s) / UC Diagnoses   Final diagnoses:  Generalized abdominal pain     Discharge Instructions     Take the medicine to treat for pinworms as directed: 2 tablets today on an empty stomach and then repeat in 2 weeks.    Your urine was normal today.    Your stool test are pending.  We will call you if anything comes back positive requiring additional treatment.    Go to the emergency department if your abdominal pain gets acutely worse.    Return here or go to the emergency department if you develop fever, chills, cough, shortness of breath, vomiting, difficulty with urination, back pain, penile discharge, testicular pain.    Establish yourself with a primary care provider such as the one listed.        ED Prescriptions    Medication Sig Dispense Auth. Provider   albendazole (ALBENZA) 200 MG tablet Take 2 tablets (400 mg total) by mouth as directed. Take 2 tablets today.  Then repeat in 2 weeks. 4 tablet Sharion Balloon, NP     Controlled Substance Prescriptions [place] Controlled Substance Registry consulted? Not Applicable   Sharion Balloon, NP 10/20/18 1545

## 2018-10-20 NOTE — ED Triage Notes (Addendum)
Pt cc abdominal pain, and nauseous. Pt thinks he saw a parasite in his stool. 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 )

## 2018-10-21 ENCOUNTER — Telehealth (HOSPITAL_COMMUNITY): Payer: Self-pay | Admitting: Emergency Medicine

## 2018-10-21 LAB — GASTROINTESTINAL PANEL BY PCR, STOOL (REPLACES STOOL CULTURE)

## 2018-10-21 NOTE — Telephone Encounter (Signed)
Negative, Attempted to reach patient. No answer at this time

## 2018-10-22 LAB — NOVEL CORONAVIRUS, NAA (HOSP ORDER, SEND-OUT TO REF LAB; TAT 18-24 HRS): SARS-CoV-2, NAA: NOT DETECTED

## 2018-10-25 ENCOUNTER — Encounter (HOSPITAL_COMMUNITY): Payer: Self-pay
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
## 2022-03-04 ENCOUNTER — Ambulatory Visit
Admission: EM | Admit: 2022-03-04 | Discharge: 2022-03-04 | Disposition: A | Discharge status: Home / Self Care | Payer: Self-pay | Attending: Physician Assistant | Admitting: Physician Assistant

## 2022-03-04 DIAGNOSIS — K047 Periapical abscess without sinus: Secondary | ICD-10-CM

## 2022-03-04 MED ORDER — IBUPROFEN 800 MG PO TABS: 800.0000 mg | ORAL_TABLET | Freq: Three times a day (TID) | ORAL | 0 refills | Status: DC

## 2022-03-04 MED ORDER — AMOXICILLIN 500 MG PO CAPS: 500.0000 mg | ORAL_CAPSULE | Freq: Three times a day (TID) | ORAL | 0 refills | Status: DC

## 2022-03-04 NOTE — ED Provider Notes (Signed)
[facility]    CSN: [id number] Arrival date & time: 03/04/22  0947      History   Chief Complaint Chief Complaint  Patient presents with   Dental Pain    HPI Dwain Huhn is a 31 y.o. male.   Patient here today for evaluation of left-sided lower dental pain that started recently.  He states that he lost a feeling to his left wisdom tooth and now he has pain that radiates to his ear on that side.  He has tried taking Motrin with mild relief.  His last dose of same was this morning.  He has not had any fever.  He does not currently have a dentist.  The history is provided by the patient.  Dental Pain Associated symptoms: no facial swelling and no fever     History reviewed. No pertinent past medical history.  There are no problems to display for this patient.   History reviewed. No pertinent surgical history.     Home Medications    Prior to Admission medications   Medication Sig Start Date End Date Taking? Authorizing Provider  amoxicillin (AMOXIL) 500 MG capsule Take 1 capsule (500 mg total) by mouth 3 (three) times daily. 03/04/22  Yes Tomi Bamberger, PA-C  ibuprofen (ADVIL) 800 MG tablet Take 1 tablet (800 mg total) by mouth 3 (three) times daily. 03/04/22  Yes Tomi Bamberger, PA-C  acetaminophen (TYLENOL) 500 MG tablet Take 500 mg by mouth every 6 (six) hours as needed.    [provider]  albendazole (ALBENZA) 200 MG tablet Take 2 tablets (400 mg total) by mouth as directed. Take 2 tablets today.  Then repeat in 2 weeks. 10/20/18   Mickie Bail, NP  amoxicillin-clavulanate (AUGMENTIN) 875-125 MG tablet Take 1 tablet by mouth 2 (two) times daily. 05/09/18   Zachery Dauer, NP  azelastine (ASTELIN) 0.1 % nasal spray Place 1 spray into both nostrils 2 (two) times daily. Use in each nostril as directed 05/09/18   Zachery Dauer, NP  brompheniramine-pseudoephedrine-DM 30-2-10 MG/5ML syrup Take 10 mLs by mouth 3 (three) times daily as needed.  05/09/18   Zachery Dauer, NP  cyclobenzaprine (FLEXERIL) 10 MG tablet Take 1 tablet (10 mg total) by mouth 3 (three) times daily as needed for muscle spasms. Patient not taking: Reported on 07/20/2017 08/12/14   Charm Rings, MD  morphine (MSIR) 15 MG tablet Take 1 tablet (15 mg total) by mouth every 4 (four) hours as needed for severe pain. Patient not taking: Reported on 05/09/2018 10/29/17   Melene Plan, DO  traMADol (ULTRAM) 50 MG tablet Take 1 tablet (50 mg total) by mouth every 6 (six) hours as needed. Patient not taking: Reported on 07/20/2017 08/12/14   Charm Rings, MD    Family History History reviewed. No pertinent family history.  Social History Social History   Tobacco Use   Smoking status: Never   Smokeless tobacco: Never  Vaping Use   Vaping Use: Never used  Substance Use Topics   Alcohol use: Yes   Drug use: Not Currently    Types: Marijuana     Allergies   Patient has no known allergies.   Review of Systems Review of Systems  Constitutional:  Negative for chills and fever.  HENT:  Positive for dental problem. Negative for facial swelling.   Eyes:  Negative for discharge and redness.  Respiratory:  Negative for shortness of breath.   Neurological:  Negative for numbness.  Physical Exam Triage Vital Signs ED Triage Vitals  Enc Vitals Group     BP 03/04/22 1016 123/66     Pulse Rate 03/04/22 1016 70     Resp 03/04/22 1016 15     Temp 03/04/22 1016 97.7 F (36.5 C)     Temp src --      SpO2 03/04/22 1016 98 %     Weight --      Height --      Head Circumference --      Peak Flow --      Pain Score 03/04/22 1015 4     Pain Loc --      Pain Edu? --      Excl. in [place]? --    No data found.  Updated Vital Signs BP 123/66   Pulse 70   Temp 97.7 F (36.5 C)   Resp 15   SpO2 98%      Physical Exam Vitals and nursing note reviewed.  Constitutional:      General: He is not in acute distress.    Appearance: Normal appearance. He is not  ill-appearing.  HENT:     Head: Normocephalic and atraumatic.     Nose: No congestion or rhinorrhea.     Mouth/Throat:     Comments: Dental caries noted to left lower posterior molar with gingival swelling Eyes:     Conjunctiva/sclera: Conjunctivae normal.  Cardiovascular:     Rate and Rhythm: Normal rate.  Pulmonary:     Effort: Pulmonary effort is normal.  Neurological:     Mental Status: He is alert.  Psychiatric:        Mood and Affect: Mood normal.        Behavior: Behavior normal.        Thought Content: Thought content normal.      UC Treatments / Results  Labs (all labs ordered are listed, but only abnormal results are displayed) Labs Reviewed - No data to display  EKG   Radiology No results found.  Procedures Procedures (including critical care time)  Medications Ordered in UC Medications - No data to display  Initial Impression / Assessment and Plan / UC Course  I have reviewed the triage vital signs and the nursing notes.  Pertinent labs & imaging results that were available during my care of the patient were reviewed by me and considered in my medical decision making (see chart for details).    Will treat to cover possible dental abscess with amoxicillin and ibuprofen prescribed for pain.  Recommended patient eat before taking such high dose NSAID.  Encouraged follow-up with dentistry and list of more affordable dentists provided to patient today.  Encouraged sooner follow-up with any further concerns.  Final Clinical Impressions(s) / UC Diagnoses   Final diagnoses:  Dental abscess   Discharge Instructions   None    ED Prescriptions     Medication Sig Dispense Auth. Provider   amoxicillin (AMOXIL) 500 MG capsule Take 1 capsule (500 mg total) by mouth 3 (three) times daily. 21 capsule Erma Pinto F, PA-C   ibuprofen (ADVIL) 800 MG tablet Take 1 tablet (800 mg total) by mouth 3 (three) times daily. 21 tablet Tomi Bamberger, PA-C      PDMP  not reviewed this encounter.   Tomi Bamberger, PA-C 03/04/22 1257

## 2022-03-04 NOTE — ED Triage Notes (Signed)
Pt presents to [facility] with co of L sided dental pain. Pt reports he lost a filling on L wisdom tooth and now has pain with otalgia on that side. Pt reports motrin for pain.

## 2022-05-30 ENCOUNTER — Ambulatory Visit
Admission: EM | Admit: 2022-05-30 | Discharge: 2022-05-30 | Disposition: A | Discharge status: Home / Self Care | Payer: 59 | Attending: Family Medicine | Admitting: Family Medicine

## 2022-05-30 DIAGNOSIS — J4521 Mild intermittent asthma with (acute) exacerbation: Secondary | ICD-10-CM | POA: Diagnosis not present

## 2022-05-30 DIAGNOSIS — K029 Dental caries, unspecified: Secondary | ICD-10-CM

## 2022-05-30 DIAGNOSIS — K122 Cellulitis and abscess of mouth: Secondary | ICD-10-CM | POA: Diagnosis not present

## 2022-05-30 MED ORDER — KETOROLAC TROMETHAMINE 30 MG/ML IJ SOLN
30.0000 mg | Freq: Once | INTRAMUSCULAR | Status: AC
  Administered 2022-05-30: 30 mg via INTRAMUSCULAR

## 2022-05-30 MED ORDER — AMOXICILLIN-POT CLAVULANATE 875-125 MG PO TABS: 1.0000 | ORAL_TABLET | Freq: Two times a day (BID) | ORAL | 0 refills | Status: AC

## 2022-05-30 MED ORDER — PREDNISONE 20 MG PO TABS: 40.0000 mg | ORAL_TABLET | Freq: Every day | ORAL | 0 refills | Status: AC

## 2022-05-30 MED ORDER — ALBUTEROL SULFATE HFA 108 (90 BASE) MCG/ACT IN AERS: 2.0000 | INHALATION_SPRAY | RESPIRATORY_TRACT | 0 refills | Status: DC | PRN

## 2022-05-30 NOTE — ED Provider Notes (Signed)
[facility]    CSN: [id number] Arrival date & time: 05/30/22  0919      History   Chief Complaint Chief Complaint  Patient presents with   Dental Pain    HPI Andrew Cervantes is a 33 y.o. male.    Dental Pain  Here for left-sided tooth pain and swelling in his left cheek.  Symptoms began 2 or 3 days ago, but it worsened this morning.  No fever or chills.  It hurts to swallow on the left side.  No new congestion or cough.  He has had cough and congestion in his chest for the last 3 months.  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  He did have asthma when he was a child  History reviewed. No pertinent past medical history.  There are no problems to display for this patient.   History reviewed. No pertinent surgical history.     Home Medications    Prior to Admission medications   Medication Sig Start Date End Date Taking? Authorizing Provider  albuterol (VENTOLIN HFA) 108 (90 Base) MCG/ACT inhaler Inhale 2 puffs into the lungs every 4 (four) hours as needed for wheezing or shortness of breath. 05/30/22  Yes Barrett Henle, MD  amoxicillin-clavulanate (AUGMENTIN) 875-125 MG tablet Take 1 tablet by mouth 2 (two) times daily for 7 days. 05/30/22 06/06/22 Yes Elysia Grand, Gwenlyn Perking, MD  predniSONE (DELTASONE) 20 MG tablet Take 2 tablets (40 mg total) by mouth daily with breakfast for 5 days. 05/30/22 06/04/22 Yes Jasmeet Manton, Gwenlyn Perking, MD  acetaminophen (TYLENOL) 500 MG tablet Take 500 mg by mouth every 6 (six) hours as needed.    [provider]  azelastine (ASTELIN) 0.1 % nasal spray Place 1 spray into both nostrils 2 (two) times daily. Use in each nostril as directed 05/09/18   Shella Maxim, NP    Family History History reviewed. No pertinent family history.  Social History Social History   Tobacco Use   Smoking status: Never   Smokeless tobacco: Never  Vaping Use   Vaping Use: Never used  Substance Use Topics   Alcohol use: Yes    Drug use: Not Currently    Types: Marijuana     Allergies   Patient has no known allergies.   Review of Systems Review of Systems   Physical Exam Triage Vital Signs ED Triage Vitals  Enc Vitals Group     BP 05/30/22 0932 116/70     Pulse Rate 05/30/22 0932 (!) 59     Resp 05/30/22 0932 20     Temp 05/30/22 0932 98 F (36.7 C)     Temp Source 05/30/22 0932 Oral     SpO2 05/30/22 0932 95 %     Weight --      Height --      Head Circumference --      Peak Flow --      Pain Score 05/30/22 0931 8     Pain Loc --      Pain Edu? --      Excl. in [place]? --    No data found.  Updated Vital Signs BP 116/70 (BP Location: Left Arm)   Pulse (!) 59   Temp 98 F (36.7 C) (Oral)   Resp 20   SpO2 95%   Visual Acuity Right Eye Distance:   Left Eye Distance:   Bilateral Distance:    Right Eye Near:   Left Eye Near:  Bilateral Near:     Physical Exam Vitals reviewed.  Constitutional:      General: He is not in acute distress.    Appearance: He is not ill-appearing, toxic-appearing or diaphoretic.  HENT:     Head:     Comments: Some swelling of the left jaw near the angle.  It is mildly tender.  No fluctuance.  No erythema    Right Ear: Tympanic membrane and ear canal normal.     Left Ear: Tympanic membrane and ear canal normal.     Nose: Nose normal.     Mouth/Throat:     Mouth: Mucous membranes are moist.     Pharynx: No oropharyngeal exudate or posterior oropharyngeal erythema.     Comments: There is a carious tooth at the posterior left lower dental ridge.  No sign of an abscess at present Eyes:     Extraocular Movements: Extraocular movements intact.     Conjunctiva/sclera: Conjunctivae normal.     Pupils: Pupils are equal, round, and reactive to light.  Cardiovascular:     Rate and Rhythm: Normal rate and regular rhythm.     Heart sounds: No murmur heard. Pulmonary:     Effort: No respiratory distress.     Breath sounds: No stridor. No rhonchi or rales.      Comments: There are bilateral expiratory wheezes with good air movement. Musculoskeletal:     Cervical back: Neck supple.  Lymphadenopathy:     Cervical: No cervical adenopathy.  Skin:    Capillary Refill: Capillary refill takes less than 2 seconds.     Coloration: Skin is not jaundiced or pale.  Neurological:     General: No focal deficit present.     Mental Status: He is alert and oriented to person, place, and time.  Psychiatric:        Behavior: Behavior normal.      UC Treatments / Results  Labs (all labs ordered are listed, but only abnormal results are displayed) Labs Reviewed - No data to display  EKG   Radiology No results found.  Procedures Procedures (including critical care time)  Medications Ordered in UC Medications  ketorolac (TORADOL) 30 MG/ML injection 30 mg (has no administration in time range)    Initial Impression / Assessment and Plan / UC Course  I have reviewed the triage vital signs and the nursing notes.  Pertinent labs & imaging results that were available during my care of the patient were reviewed by me and considered in my medical decision making (see chart for details).        I am going to treat the oral/dental infection with Augmentin.  I think he is also been having an asthma exacerbation, so albuterol and prednisone are sent in.  He is going to continue the over-the-counter pain relief he has been using, as it has been helping some.  We discussed getting in with a dentist and he is given instructions on how to self schedule with a primary care provider on the [facility] health website   Final Clinical Impressions(s) / UC Diagnoses   Final diagnoses:  Oral infection  Dental caries  Mild intermittent asthma with acute exacerbation     Discharge Instructions      You have been given a shot of Toradol 30 mg today.  Take amoxicillin-clavulanate 875 mg--1 tab twice daily with food for 7 days  Albuterol inhaler--do 2 puffs  every 4 hours as needed for shortness of breath or wheezing  Take  prednisone 20 mg--2 daily for 5 days  You can use the QR code/website at the back of the summary paperwork to schedule yourself a new patient appointment with primary care  It would be good to see a dentist      ED Prescriptions     Medication Sig Dispense Auth. Provider   amoxicillin-clavulanate (AUGMENTIN) 875-125 MG tablet Take 1 tablet by mouth 2 (two) times daily for 7 days. 14 tablet Bleu Minerd, Gwenlyn Perking, MD   albuterol (VENTOLIN HFA) 108 (90 Base) MCG/ACT inhaler Inhale 2 puffs into the lungs every 4 (four) hours as needed for wheezing or shortness of breath. 1 each Barrett Henle, MD   predniSONE (DELTASONE) 20 MG tablet Take 2 tablets (40 mg total) by mouth daily with breakfast for 5 days. 10 tablet Windy Carina Gwenlyn Perking, MD      I have reviewed the PDMP during this encounter.   Barrett Henle, MD 05/30/22 [phone]

## 2022-05-30 NOTE — Discharge Instructions (Addendum)
You have been given a shot of Toradol 30 mg today.  Take amoxicillin-clavulanate 875 mg--1 tab twice daily with food for 7 days  Albuterol inhaler--do 2 puffs every 4 hours as needed for shortness of breath or wheezing  Take prednisone 20 mg--2 daily for 5 days  You can use the QR code/website at the back of the summary paperwork to schedule yourself a new patient appointment with primary care  It would be good to see a dentist

## 2022-05-30 NOTE — ED Triage Notes (Signed)
Pt reports left side back bottom tooth pain that has made his cheek swollen x 3 days ago. His left ear hurts and reports throat pain making it hard to swallow.

## 2023-04-21 DIAGNOSIS — R197 Diarrhea, unspecified: Secondary | ICD-10-CM | POA: Diagnosis not present

## 2023-04-21 DIAGNOSIS — K529 Noninfective gastroenteritis and colitis, unspecified: Secondary | ICD-10-CM | POA: Diagnosis not present

## 2023-04-21 DIAGNOSIS — R112 Nausea with vomiting, unspecified: Secondary | ICD-10-CM | POA: Diagnosis not present

## 2023-04-21 DIAGNOSIS — R1032 Left lower quadrant pain: Secondary | ICD-10-CM | POA: Diagnosis not present

## 2023-09-28 ENCOUNTER — Ambulatory Visit
Admission: EM | Admit: 2023-09-28 | Discharge: 2023-09-28 | Disposition: A | Discharge status: Home / Self Care | Payer: Self-pay | Attending: Nurse Practitioner | Admitting: Nurse Practitioner

## 2023-09-28 DIAGNOSIS — K0889 Other specified disorders of teeth and supporting structures: Secondary | ICD-10-CM

## 2023-09-28 DIAGNOSIS — K047 Periapical abscess without sinus: Secondary | ICD-10-CM

## 2023-09-28 MED ORDER — IBUPROFEN 800 MG PO TABS: 800.0000 mg | ORAL_TABLET | Freq: Three times a day (TID) | ORAL | 0 refills | Status: DC

## 2023-09-28 MED ORDER — AMOXICILLIN-POT CLAVULANATE 875-125 MG PO TABS: 1.0000 | ORAL_TABLET | Freq: Two times a day (BID) | ORAL | 0 refills | Status: DC

## 2023-09-28 MED ORDER — KETOROLAC TROMETHAMINE 30 MG/ML IJ SOLN
30.0000 mg | Freq: Once | INTRAMUSCULAR | Status: AC
  Administered 2023-09-28: 30 mg via INTRAMUSCULAR

## 2023-09-28 NOTE — ED Provider Notes (Signed)
 [facility]    CSN: [id number] Arrival date & time: 09/28/23  1836      History   Chief Complaint No chief complaint on file.   HPI Andrew Cervantes is a 33 y.o. male.   The history is provided by the patient.   Patient presents with a 2-day history of left-sided dental pain and left ear pain.  Patient reports that he does have a questionable tooth that is most likely causing his symptoms.  Denies fever, chills, facial swelling, chest pain, abdominal pain, nausea, vomiting, or diarrhea.  Patient states he has been taking Tylenol  for symptoms with minimal relief.  States pain worsened today while he was at work.  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  History reviewed. No pertinent past medical history.  There are no active problems to display for this patient.   History reviewed. No pertinent surgical history.     Home Medications    Prior to Admission medications   Medication Sig Start Date End Date Taking? Authorizing Provider  amoxicillin -clavulanate (AUGMENTIN ) 875-125 MG tablet Take 1 tablet by mouth every 12 (twelve) hours. 09/28/23  Yes Leath-Warren, Etta PARAS, NP  ibuprofen  (ADVIL ) 800 MG tablet Take 1 tablet (800 mg total) by mouth 3 (three) times daily. 09/28/23  Yes Leath-Warren, Etta PARAS, NP  acetaminophen  (TYLENOL ) 500 MG tablet Take 500 mg by mouth every 6 (six) hours as needed.    [provider]  albuterol  (VENTOLIN  HFA) 108 (90 Base) MCG/ACT inhaler Inhale 2 puffs into the lungs every 4 (four) hours as needed for wheezing or shortness of breath. 05/30/22   Vonna Sharlet POUR, MD  azelastine  (ASTELIN ) 0.1 % nasal spray Place 1 spray into both nostrils 2 (two) times daily. Use in each nostril as directed 05/09/18   Arlyss Rumpf, NP    Family History History reviewed. No pertinent family history.  Social History Social History   Tobacco Use   Smoking status: Never   Smokeless tobacco: Never  Vaping Use   Vaping status:  Never Used  Substance Use Topics   Alcohol use: Yes   Drug use: Not Currently    Types: Marijuana     Allergies   Patient has no known allergies.   Review of Systems Review of Systems Per HPI  Physical Exam Triage Vital Signs ED Triage Vitals  Encounter Vitals Group     BP 09/28/23 1916 135/84     Girls Systolic BP Percentile --      Girls Diastolic BP Percentile --      Boys Systolic BP Percentile --      Boys Diastolic BP Percentile --      Pulse Rate 09/28/23 1916 70     Resp 09/28/23 1916 18     Temp 09/28/23 1916 98.2 F (36.8 C)     Temp Source 09/28/23 1916 Oral     SpO2 09/28/23 1916 96 %     Weight --      Height --      Head Circumference --      Peak Flow --      Pain Score 09/28/23 1917 10     Pain Loc --      Pain Education --      Exclude from Growth Chart --    No data found.  Updated Vital Signs BP 135/84 (BP Location: Right Arm)   Pulse 70   Temp 98.2 F (36.8 C) (Oral)   Resp 18  SpO2 96%   Visual Acuity Right Eye Distance:   Left Eye Distance:   Bilateral Distance:    Right Eye Near:   Left Eye Near:    Bilateral Near:     Physical Exam Vitals and nursing note reviewed.  Constitutional:      General: He is not in acute distress.    Appearance: Normal appearance.  HENT:     Head: Normocephalic.     Mouth/Throat:     Lips: Pink.     Dentition: Abnormal dentition. Dental tenderness, gingival swelling and dental caries present.      Comments: Dental caries noted to left lower posterior molar with gingival swelling  Eyes:     Extraocular Movements: Extraocular movements intact.     Pupils: Pupils are equal, round, and reactive to light.    Cardiovascular:     Rate and Rhythm: Normal rate and regular rhythm.     Pulses: Normal pulses.     Heart sounds: Normal heart sounds.  Pulmonary:     Effort: Pulmonary effort is normal.     Breath sounds: Normal breath sounds.  Abdominal:     Palpations: Abdomen is soft.      Tenderness: There is no abdominal tenderness.   Musculoskeletal:     Cervical back: Normal range of motion.   Skin:    General: Skin is warm and dry.   Neurological:     General: No focal deficit present.     Mental Status: He is alert and oriented to person, place, and time.   Psychiatric:        Mood and Affect: Mood normal.        Behavior: Behavior normal.      UC Treatments / Results  Labs (all labs ordered are listed, but only abnormal results are displayed) Labs Reviewed - No data to display  EKG   Radiology No results found.  Procedures Procedures (including critical care time)  Medications Ordered in UC Medications  ketorolac  (TORADOL ) 30 MG/ML injection 30 mg (has no administration in time range)    Initial Impression / Assessment and Plan / UC Course  I have reviewed the triage vital signs and the nursing notes.  Pertinent labs & imaging results that were available during my care of the patient were reviewed by me and considered in my medical decision making (see chart for details).  Will treat to cover possible dental abscess with Augmentin  and ibuprofen  prescribed for pain.  Toradol  30 mg IM administered for pain.  Recommended patient eat before taking such high dose NSAID.  Supportive care recommendations were provided and discussed with the patient to include warm salt water gargles, and use of warm compresses.  Patient was given list of dental resources to establish care for current symptoms.  Discussed indications with patient regarding follow-up.  Patient was in agreement with this plan of care and verbalizes understanding.  All questions were answered.  Patient stable for discharge.    Final Clinical Impressions(s) / UC Diagnoses   Final diagnoses:  Dentalgia  Dental abscess     Discharge Instructions      Take medication as prescribed. May take over-the-counter Tylenol  extra strength 1 to 2 hours after Ibuprofen  for breakthrough pain. Warm  salt water gargles 3-4 times daily until symptoms improve. Recommend warm compresses to the affected area to help with pain and discomfort. I have provided a dental resource guide for you to follow-up with a dentist.  Recommend following up within the  next 7 to 10 days.    ED Prescriptions     Medication Sig Dispense Auth. Provider   amoxicillin -clavulanate (AUGMENTIN ) 875-125 MG tablet Take 1 tablet by mouth every 12 (twelve) hours. 14 tablet Leath-Warren, Etta PARAS, NP   ibuprofen  (ADVIL ) 800 MG tablet Take 1 tablet (800 mg total) by mouth 3 (three) times daily. 21 tablet Leath-Warren, Etta PARAS, NP      PDMP not reviewed this encounter.   Gilmer Etta PARAS, NP 09/28/23 1951

## 2023-09-28 NOTE — ED Triage Notes (Signed)
 Pt reports he has tooth pain on the left side of his mouth and also has left ear pain x 2 days    Took tylenol 

## 2023-09-28 NOTE — Discharge Instructions (Signed)
 Take medication as prescribed. May take over-the-counter Tylenol  extra strength 1 to 2 hours after Ibuprofen  for breakthrough pain. Warm salt water gargles 3-4 times daily until symptoms improve. Recommend warm compresses to the affected area to help with pain and discomfort. I have provided a dental resource guide for you to follow-up with a dentist.  Recommend following up within the next 7 to 10 days.

## 2023-12-26 ENCOUNTER — Ambulatory Visit
Admission: EM | Admit: 2023-12-26 | Discharge: 2023-12-26 | Disposition: A | Discharge status: Home / Self Care | Attending: Nurse Practitioner | Admitting: Nurse Practitioner

## 2023-12-26 ENCOUNTER — Encounter: Payer: Self-pay | Admitting: Emergency Medicine

## 2023-12-26 ENCOUNTER — Emergency Department (HOSPITAL_COMMUNITY)
Admission: EM | Admit: 2023-12-26 | Discharge: 2023-12-26 | Disposition: A | Discharge status: Home / Self Care | Attending: Emergency Medicine | Admitting: Emergency Medicine

## 2023-12-26 ENCOUNTER — Other Ambulatory Visit: Payer: Self-pay

## 2023-12-26 ENCOUNTER — Encounter (HOSPITAL_COMMUNITY): Payer: Self-pay

## 2023-12-26 DIAGNOSIS — K029 Dental caries, unspecified: Secondary | ICD-10-CM | POA: Diagnosis not present

## 2023-12-26 DIAGNOSIS — K0889 Other specified disorders of teeth and supporting structures: Secondary | ICD-10-CM | POA: Diagnosis not present

## 2023-12-26 DIAGNOSIS — K047 Periapical abscess without sinus: Secondary | ICD-10-CM

## 2023-12-26 MED ORDER — IBUPROFEN 800 MG PO TABS: 800.0000 mg | ORAL_TABLET | Freq: Three times a day (TID) | ORAL | 0 refills | Status: AC | PRN

## 2023-12-26 MED ORDER — AMOXICILLIN-POT CLAVULANATE 875-125 MG PO TABS: 1.0000 | ORAL_TABLET | Freq: Two times a day (BID) | ORAL | 0 refills | Status: AC

## 2023-12-26 MED ORDER — LIDOCAINE VISCOUS HCL 2 % MT SOLN: 15.0000 mL | OROMUCOSAL | 0 refills | Status: AC | PRN

## 2023-12-26 MED ORDER — KETOROLAC TROMETHAMINE 60 MG/2ML IM SOLN
60.0000 mg | Freq: Once | INTRAMUSCULAR | Status: AC
  Administered 2023-12-26: 60 mg via INTRAMUSCULAR

## 2023-12-26 MED ORDER — LIDOCAINE VISCOUS HCL 2 % MT SOLN
15.0000 mL | Freq: Once | OROMUCOSAL | Status: AC
  Administered 2023-12-26: 15 mL via OROMUCOSAL

## 2023-12-26 MED ORDER — CHLORHEXIDINE GLUCONATE 0.12 % MT SOLN: 15.0000 mL | OROMUCOSAL | 0 refills | Status: AC

## 2023-12-26 MED ORDER — BUPIVACAINE-EPINEPHRINE (PF) 0.5% -1:200000 IJ SOLN
1.8000 mL | Freq: Once | INTRAMUSCULAR | Status: AC
  Administered 2023-12-26: 1.8 mL
  Filled 2023-12-26: qty 1.8

## 2023-12-26 NOTE — Discharge Instructions (Signed)
 Is a pleasure seeing care of you today.  You are seen for dental pain.  We did a dental block to help with your pain.  We are glad you are feeling much better.  Continue the ibuprofen  and Tylenol  at home as prescribed and the antibiotics.  If you develop fever, facial swelling, trouble swallowing or breathing or any other worsening symptoms please come back to the ER right away.

## 2023-12-26 NOTE — ED Triage Notes (Signed)
 Pt stated that he began having upper right wisdom tooth pain on Monday and the pain is now unbearable. Pt in tears in triage.

## 2023-12-26 NOTE — Discharge Instructions (Addendum)
 You were seen today for dental pain. The exam showed significant tooth decay and swollen gums around the affected tooth, which is most likely causing your discomfort. It is very important that you see a dentist as soon as possible to fully treat the problem. I have given you the names several clinics that offer free or discounted dental services--please make an appointment with whichever one can see you the soonest. Take the antibiotics exactly as prescribed to help prevent or treat any infection. Use the prescribed mouth rinse twice a day after brushing your teeth to help keep the area clean. After eating, rinse your mouth with water to remove any food particles and reduce irritation. Take the pain medicine with food to help with pain and inflammation. Do not take any aspirin or other over-the-counter NSAIDs while using this medication. Tylenol  can also be used along with the other medications. Take two 500 mg tablets (a total of 1000 mg) every 6 hours as needed. Do not take more than 4000 mg of Tylenol  in 24 hours. Avoid any foods or drinks that make the pain worse. Stick to soft foods while your mouth is healing, and do not use straws, as this can delay healing.

## 2023-12-26 NOTE — ED Provider Notes (Signed)
 [facility]    CSN: [id number] Arrival date & time: 12/26/23  9196      History   Chief Complaint Chief Complaint  Patient presents with   Dental Pain    HPI Andrew Cervantes is a 33 y.o. male.   Discussed the use of AI scribe software for clinical note transcription with the patient, who gave verbal consent to proceed.   The patient presents with dental pain on the right side of their mouth, which began on about 6 days ago. They report experiencing some swelling on the inside of their face, and headaches on the affected side. The patient describes a sensation of biting their cheek and notes that water helps alleviate the discomfort. They have attempted over-the-counter remedies without success. The patient mentions a history of wisdom teeth issues. They currently do not have a dentist and lack dental insurance. The pain has been impacting their ability to eat. No allergies are reported. The patient denies smoking but he does vape.   The following sections of the patient's history were reviewed and updated as appropriate: allergies, current medications, past family history, past medical history, past social history, past surgical history, and problem list.     History reviewed. No pertinent past medical history.  There are no active problems to display for this patient.   History reviewed. No pertinent surgical history.     Home Medications    Prior to Admission medications   Medication Sig Start Date End Date Taking? Authorizing Provider  amoxicillin -clavulanate (AUGMENTIN ) 875-125 MG tablet Take 1 tablet by mouth 2 (two) times daily after a meal. 12/26/23  Yes Iola Lukes, FNP  chlorhexidine (PERIDEX) 0.12 % solution Use as directed 15 mLs in the mouth or throat 2 (two) times daily at 8 am and 6 pm for 7 days. Brush teeth then swish in mouth for 2 minutes then spit out. Do not rinse mouth after use. Avoid eating, drinking, smoking and vaping for at  least 30 minutes after use 12/26/23 01/02/24 Yes Krishan Mcbreen, FNP  ibuprofen  (ADVIL ) 800 MG tablet Take 1 tablet (800 mg total) by mouth every 8 (eight) hours as needed (pain). Do not take any additional over-the-counter NSAIDs (such as Advil , Aleve, or other ibuprofen /naproxen products) while using this medication. 12/26/23  Yes Iola Lukes, FNP  lidocaine  (XYLOCAINE ) 2 % solution Use as directed 15 mLs in the mouth or throat as needed for mouth pain (swish in mouth for 30 seconds then spit out. do not eat or drink for at 15 minutes after use). 12/26/23  Yes Iola Lukes, FNP  acetaminophen  (TYLENOL ) 500 MG tablet Take 500 mg by mouth every 6 (six) hours as needed.    [provider]    Family History No family history on file.  Social History Social History   Tobacco Use   Smoking status: Never   Smokeless tobacco: Never  Vaping Use   Vaping status: Every Day   Substances: Nicotine  Substance Use Topics   Alcohol use: Not Currently   Drug use: Not Currently    Types: Marijuana     Allergies   Patient has no known allergies.   Review of Systems Review of Systems  Constitutional:  Negative for fever.  HENT:  Positive for dental problem.   Neurological:  Positive for headaches.  All other systems reviewed and are negative.    Physical Exam Triage Vital Signs ED Triage Vitals [12/26/23 0836]  Encounter Vitals Group     BP  111/76     Girls Systolic BP Percentile      Girls Diastolic BP Percentile      Boys Systolic BP Percentile      Boys Diastolic BP Percentile      Pulse Rate 70     Resp 18     Temp 97.6 F (36.4 C)     Temp Source Oral     SpO2 97 %     Weight      Height      Head Circumference      Peak Flow      Pain Score 7     Pain Loc      Pain Education      Exclude from Growth Chart    No data found.  Updated Vital Signs BP 111/76 (BP Location: Left Arm)   Pulse 70   Temp 97.6 F (36.4 C) (Oral)   Resp 18   SpO2 97%    Visual Acuity Right Eye Distance:   Left Eye Distance:   Bilateral Distance:    Right Eye Near:   Left Eye Near:    Bilateral Near:     Physical Exam Vitals reviewed.  Constitutional:      General: He is awake. He is not in acute distress.    Appearance: Normal appearance. He is well-developed. He is not ill-appearing, toxic-appearing or diaphoretic.  HENT:     Head: Normocephalic.     Jaw: There is normal jaw occlusion. No swelling or pain on movement.     Right Ear: Hearing normal.     Left Ear: Hearing normal.     Nose: Nose normal.     Mouth/Throat:     Mouth: Mucous membranes are moist.     Dentition: Does not have dentures. Dental tenderness, gingival swelling and dental caries present. No dental abscesses or gum lesions.     Pharynx: Oropharynx is clear. Uvula midline.      Comments: Dental decay observed in the right lower back molar with associated swelling of the adjacent gingiva. No oral or gingival lesions identified, and no evidence of dental abscess present.  Eyes:     General: Vision grossly intact.     Conjunctiva/sclera: Conjunctivae normal.  Cardiovascular:     Rate and Rhythm: Normal rate and regular rhythm.     Heart sounds: Normal heart sounds.  Pulmonary:     Effort: Pulmonary effort is normal.     Breath sounds: Normal breath sounds and air entry.  Musculoskeletal:        General: Normal range of motion.     Cervical back: Normal range of motion and neck supple.  Skin:    General: Skin is warm and dry.  Neurological:     General: No focal deficit present.     Mental Status: He is alert and oriented to person, place, and time.  Psychiatric:        Speech: Speech normal.        Behavior: Behavior is cooperative.      UC Treatments / Results  Labs (all labs ordered are listed, but only abnormal results are displayed) Labs Reviewed - No data to display  EKG   Radiology No results found.  Procedures Procedures (including critical  care time)  Medications Ordered in UC Medications  ketorolac  (TORADOL ) injection 60 mg (60 mg Intramuscular Given 12/26/23 0934)  lidocaine  (XYLOCAINE ) 2 % viscous mouth solution 15 mL (15 mLs Mouth/Throat Given 12/26/23 0933)  Initial Impression / Assessment and Plan / UC Course  I have reviewed the triage vital signs and the nursing notes.  Pertinent labs & imaging results that were available during my care of the patient were reviewed by me and considered in my medical decision making (see chart for details).    The patient presents with dental pain. Examination shows dental decay but no evidence of abscess, fluctuance, or other acute dental emergency. The pain is most likely related to underlying caries and chronic poor dentition. The patient was prescribed antibiotics, Peridex mouth rinse, viscous lidocaine  and ibuprofen  for pain control, with instructions to avoid additional over-the-counter NSAIDs. May supplement with acetaminophen  if further pain relief is needed. Urgent dental follow-up is recommended, and dental resource information was provided. Patient advised to schedule an appointment with the first available dental provider. Strict ED precautions given for worsening pain, swelling, fever, or other concerning symptoms.  Today's evaluation has revealed no signs of a dangerous process. Discussed diagnosis with patient and/or guardian. Patient and/or guardian aware of their diagnosis, possible red flag symptoms to watch out for and need for close follow up. Patient and/or guardian understands verbal and written discharge instructions. Patient and/or guardian comfortable with plan and disposition.  Patient and/or guardian has a clear mental status at this time, good insight into illness (after discussion and teaching) and has clear judgment to make decisions regarding their care  Documentation was completed with the aid of voice recognition software. Transcription may contain  typographical errors.   Final Clinical Impressions(s) / UC Diagnoses   Final diagnoses:  Infected dental caries  Dentalgia     Discharge Instructions      You were seen today for dental pain. The exam showed significant tooth decay and swollen gums around the affected tooth, which is most likely causing your discomfort. It is very important that you see a dentist as soon as possible to fully treat the problem. I have given you the names several clinics that offer free or discounted dental services--please make an appointment with whichever one can see you the soonest. Take the antibiotics exactly as prescribed to help prevent or treat any infection. Use the prescribed mouth rinse twice a day after brushing your teeth to help keep the area clean. After eating, rinse your mouth with water to remove any food particles and reduce irritation. Take the pain medicine with food to help with pain and inflammation. Do not take any aspirin or other over-the-counter NSAIDs while using this medication. Tylenol  can also be used along with the other medications. Take two 500 mg tablets (a total of 1000 mg) every 6 hours as needed. Do not take more than 4000 mg of Tylenol  in 24 hours. Avoid any foods or drinks that make the pain worse. Stick to soft foods while your mouth is healing, and do not use straws, as this can delay healing.       ED Prescriptions     Medication Sig Dispense Auth. Provider   amoxicillin -clavulanate (AUGMENTIN ) 875-125 MG tablet Take 1 tablet by mouth 2 (two) times daily after a meal. 14 tablet Iola Lukes, FNP   chlorhexidine (PERIDEX) 0.12 % solution Use as directed 15 mLs in the mouth or throat 2 (two) times daily at 8 am and 6 pm for 7 days. Brush teeth then swish in mouth for 2 minutes then spit out. Do not rinse mouth after use. Avoid eating, drinking, smoking and vaping for at least 30 minutes after use 210 mL Duru Reiger,  Lucie, FNP   ibuprofen  (ADVIL ) 800 MG tablet  Take 1 tablet (800 mg total) by mouth every 8 (eight) hours as needed (pain). Do not take any additional over-the-counter NSAIDs (such as Advil , Aleve, or other ibuprofen /naproxen products) while using this medication. 21 tablet Iola Lucie, FNP   lidocaine  (XYLOCAINE ) 2 % solution Use as directed 15 mLs in the mouth or throat as needed for mouth pain (swish in mouth for 30 seconds then spit out. do not eat or drink for at 15 minutes after use). 200 mL Iola Lucie, FNP      PDMP not reviewed this encounter.   Iola Lucie, [place] 12/26/23 1311

## 2023-12-26 NOTE — ED Provider Notes (Addendum)
 [place] [facility] AT [facility] Provider Note   CSN: [id number] Arrival date & time: 12/26/23  1457     Patient presents with: Dental Pain   Andrew Cervantes is a 33 y.o. male.  Denies any significant PMH.  Presents to the ER for right upper wisdom tooth pain.  Been going on for about 6 days but has been getting worse, he has not been able to get into a dentist.  He tried over-the-counter medications without relief, no fever or chills, he has noticed a little swelling to the area of the right jaw.  He went to urgent care today and was given a dose of Toradol  and prescribed ibuprofen , viscous lidocaine , Peridex rinse and Augmentin .  He states that the lidocaine  worked briefly but then the pain came back and has been even worse he states its on and off, but when it comes back its is severe and throbbing.  He states the other that gives him relief is drinking water so he has been continuously sipping water to help with the pain.    Dental Pain      Prior to Admission medications   Medication Sig Start Date End Date Taking? Authorizing Provider  acetaminophen  (TYLENOL ) 500 MG tablet Take 500 mg by mouth every 6 (six) hours as needed.    [provider]  amoxicillin -clavulanate (AUGMENTIN ) 875-125 MG tablet Take 1 tablet by mouth 2 (two) times daily after a meal. 12/26/23   Iola Lukes, FNP  chlorhexidine (PERIDEX) 0.12 % solution Use as directed 15 mLs in the mouth or throat 2 (two) times daily at 8 am and 6 pm for 7 days. Brush teeth then swish in mouth for 2 minutes then spit out. Do not rinse mouth after use. Avoid eating, drinking, smoking and vaping for at least 30 minutes after use 12/26/23 01/02/24  Iola Lukes, FNP  ibuprofen  (ADVIL ) 800 MG tablet Take 1 tablet (800 mg total) by mouth every 8 (eight) hours as needed (pain). Do not take any additional over-the-counter NSAIDs (such as Advil , Aleve, or other ibuprofen /naproxen products) while  using this medication. 12/26/23   Iola Lukes, FNP  lidocaine  (XYLOCAINE ) 2 % solution Use as directed 15 mLs in the mouth or throat as needed for mouth pain (swish in mouth for 30 seconds then spit out. do not eat or drink for at 15 minutes after use). 12/26/23   Murrill, Samantha, FNP    Allergies: Patient has no known allergies.    Review of Systems  Updated Vital Signs BP (!) 158/102   Pulse 86   Temp 97.6 F (36.4 C)   Resp 18   Ht 6' (1.829 m)   Wt 74.8 kg   SpO2 99%   BMI 22.38 kg/m   Physical Exam Vitals and nursing note reviewed.  Constitutional:      General: He is not in acute distress.    Appearance: He is well-developed.  HENT:     Head: Normocephalic and atraumatic.     Mouth/Throat:     Mouth: Mucous membranes are moist.     Dentition: Dental tenderness and dental caries present. No gingival swelling or dental abscesses.     Pharynx: Uvula midline.   Eyes:     Conjunctiva/sclera: Conjunctivae normal.  Cardiovascular:     Rate and Rhythm: Normal rate and regular rhythm.     Heart sounds: No murmur heard. Pulmonary:     Effort: Pulmonary effort is normal. No respiratory distress.  Breath sounds: Normal breath sounds.  Abdominal:     Palpations: Abdomen is soft.     Tenderness: There is no abdominal tenderness.  Musculoskeletal:        General: No swelling.     Cervical back: Neck supple.  Skin:    General: Skin is warm and dry.     Capillary Refill: Capillary refill takes less than 2 seconds.  Neurological:     General: No focal deficit present.     Mental Status: He is alert and oriented to person, place, and time.  Psychiatric:        Mood and Affect: Mood normal.     (all labs ordered are listed, but only abnormal results are displayed) Labs Reviewed - No data to display  EKG: None  Radiology: No results found.   Dental Block  Date/Time: 12/26/2023 4:34 PM  Performed by: Suellen Sherran LABOR, PA-C Authorized by: Suellen Sherran LABOR, PA-C   Consent:    Consent obtained:  Verbal   Consent given by:  Patient   Risks discussed:  Allergic reaction, infection, nerve damage, swelling, hematoma, intravascular injection, pain and unsuccessful block   Alternatives discussed:  No treatment Universal protocol:    Procedure explained and questions answered to patient or proxy's satisfaction: yes     Patient identity confirmed:  Verbally with patient Indications:    Indications: dental pain   Location:    Block type:  Posterior superior alveolar   Laterality:  Right Procedure details:    Syringe type:  Luer lock syringe   Needle gauge:  25 G   Anesthetic injected:  Bupivacaine 0.5% WITH epi Post-procedure details:    Outcome:  Anesthesia achieved   Procedure completion:  Tolerated well, no immediate complications    Medications Ordered in the ED  bupivacaine-epinephrine  (PF) (MARCAINE W/ EPI) 0.5% -1:200000 injection 1.8 mL (has no administration in time range)                                    Medical Decision Making Ddx: dental abscess, dental caries, dental fracture, alveolar osteitis, ludwigs angina, other ED course: Patient present with pain in the right upper wisdom tooth. They speak in full and clear sentences,  handle their oral secretions well. There is no sign of deep space abscess. There is no drainable collection.  Pain management: Pt given dental block with Marcaine for pain control had significant relief Antibiotics provided for dental infection urgent care and patient encouraged to continue these, patient was provided with outpatient dental resources earlier today at his urgent care visit as well and advised to come back to the ED for any new or worsening symptoms.   Risk Prescription drug management.        Final diagnoses:  Pain, dental    ED Discharge Orders     None          Suellen Sherran LABOR, PA-C 12/26/23 [address] 12/26/23 1635     Francesca Elsie CROME, MD 12/26/23 2024

## 2023-12-26 NOTE — ED Triage Notes (Signed)
 Pt st's he thinks he has to tooth abscess.  Pain started 6 days ago radiating into right ear.  Pt requesting antibiotic and non-narcotic pain med

## 2024-01-01 ENCOUNTER — Ambulatory Visit
Admission: EM | Admit: 2024-01-01 | Discharge: 2024-01-01 | Disposition: A | Discharge status: Home / Self Care | Attending: Internal Medicine | Admitting: Internal Medicine

## 2024-01-01 ENCOUNTER — Encounter: Payer: Self-pay | Admitting: Emergency Medicine

## 2024-01-01 DIAGNOSIS — S61452A Open bite of left hand, initial encounter: Secondary | ICD-10-CM | POA: Diagnosis not present

## 2024-01-01 DIAGNOSIS — W5501XA Bitten by cat, initial encounter: Secondary | ICD-10-CM | POA: Diagnosis not present

## 2024-01-01 DIAGNOSIS — L089 Local infection of the skin and subcutaneous tissue, unspecified: Secondary | ICD-10-CM | POA: Diagnosis not present

## 2024-01-01 DIAGNOSIS — Z23 Encounter for immunization: Secondary | ICD-10-CM | POA: Diagnosis not present

## 2024-01-01 MED ORDER — TETANUS-DIPHTH-ACELL PERTUSSIS 5-2-15.5 LF-MCG/0.5 IM SUSP
0.5000 mL | Freq: Once | INTRAMUSCULAR | Status: AC
  Administered 2024-01-01: 0.5 mL via INTRAMUSCULAR

## 2024-01-01 NOTE — ED Triage Notes (Addendum)
 Pt presents c/o cat bite on L hand x 2 days. Pt reports he just got a new cat and was attempting to introduce the new cat to his old cat. Old cat is the one that bit him. Finger went numb for a couple hours after he got bit. Vaccinations are up to date on old cat. Pt is wanting to get a tetanus shot. Not sure when last Tetanus was administered.  Pt reports he is actively on Amox 500 for previous dental issue.

## 2024-01-01 NOTE — Discharge Instructions (Addendum)
 Cat bite to the left hand by updated personal cat.  Tetanus is out of date therefore we have updated this today.  Currently on antibiotics for dental infection and would continue these antibiotics for total of 7 days for the cat bite.  Monitor your symptoms.  If there is increased pain, increased redness, fevers, purulent drainage or other concerning symptoms after being on antibiotics then recommend returning to urgent care

## 2024-01-01 NOTE — ED Provider Notes (Signed)
 [facility]    CSN: [id number] Arrival date & time: 01/01/24  1722      History   Chief Complaint Chief Complaint  Patient presents with   Animal Bite    HPI Andrew Cervantes is a 33 y.o. male.   33 year old male who presents urgent care with complaints of a cat bite to the left hand.  He reports this happened last night when he was introducing a new cat to his house.  This was his personal cat and is up-to-date on vaccinations.  He reports that his last tetanus was at least 15 years ago.  He reports that the area has gotten a little bit more red and more painful today.  He denies any fevers or chills.   Animal Bite Associated symptoms: no fever and no rash     History reviewed. No pertinent past medical history.  There are no active problems to display for this patient.   History reviewed. No pertinent surgical history.     Home Medications    Prior to Admission medications   Medication Sig Start Date End Date Taking? Authorizing Provider  amoxicillin -clavulanate (AUGMENTIN ) 875-125 MG tablet Take 1 tablet by mouth 2 (two) times daily after a meal. 12/26/23  Yes Iola Lukes, FNP  ibuprofen  (ADVIL ) 800 MG tablet Take 1 tablet (800 mg total) by mouth every 8 (eight) hours as needed (pain). Do not take any additional over-the-counter NSAIDs (such as Advil , Aleve, or other ibuprofen /naproxen products) while using this medication. 12/26/23  Yes Iola Lukes, FNP  acetaminophen  (TYLENOL ) 500 MG tablet Take 500 mg by mouth every 6 (six) hours as needed.    [provider]  chlorhexidine (PERIDEX) 0.12 % solution Use as directed 15 mLs in the mouth or throat 2 (two) times daily at 8 am and 6 pm for 7 days. Brush teeth then swish in mouth for 2 minutes then spit out. Do not rinse mouth after use. Avoid eating, drinking, smoking and vaping for at least 30 minutes after use 12/26/23 01/02/24  Iola Lukes, FNP  lidocaine  (XYLOCAINE ) 2 % solution  Use as directed 15 mLs in the mouth or throat as needed for mouth pain (swish in mouth for 30 seconds then spit out. do not eat or drink for at 15 minutes after use). 12/26/23   Iola Lukes, FNP    Family History History reviewed. No pertinent family history.  Social History Social History   Tobacco Use   Smoking status: Never    Passive exposure: Never   Smokeless tobacco: Never  Vaping Use   Vaping status: Every Day   Substances: Nicotine  Substance Use Topics   Alcohol use: Not Currently   Drug use: Not Currently    Types: Marijuana     Allergies   Patient has no known allergies.   Review of Systems Review of Systems  Constitutional:  Negative for chills and fever.  HENT:  Negative for ear pain and sore throat.   Eyes:  Negative for pain and visual disturbance.  Respiratory:  Negative for cough and shortness of breath.   Cardiovascular:  Negative for chest pain and palpitations.  Gastrointestinal:  Negative for abdominal pain and vomiting.  Genitourinary:  Negative for dysuria and hematuria.  Musculoskeletal:  Negative for arthralgias and back pain.  Skin:  Positive for wound. Negative for color change and rash.  Neurological:  Negative for seizures and syncope.  All other systems reviewed and are negative.    Physical Exam Triage  Vital Signs ED Triage Vitals  Encounter Vitals Group     BP 01/01/24 1753 (!) 158/79     Girls Systolic BP Percentile --      Girls Diastolic BP Percentile --      Boys Systolic BP Percentile --      Boys Diastolic BP Percentile --      Pulse Rate 01/01/24 1753 73     Resp 01/01/24 1753 18     Temp 01/01/24 1753 98.1 F (36.7 C)     Temp Source 01/01/24 1753 Oral     SpO2 01/01/24 1753 96 %     Weight 01/01/24 1752 164 lb 14.5 oz (74.8 kg)     Height --      Head Circumference --      Peak Flow --      Pain Score 01/01/24 1751 4     Pain Loc --      Pain Education --      Exclude from Growth Chart --    No data  found.  Updated Vital Signs BP (!) 158/79 (BP Location: Right Arm)   Pulse 73   Temp 98.1 F (36.7 C) (Oral)   Resp 18   Wt 164 lb 14.5 oz (74.8 kg)   SpO2 96%   BMI 22.37 kg/m   Visual Acuity Right Eye Distance:   Left Eye Distance:   Bilateral Distance:    Right Eye Near:   Left Eye Near:    Bilateral Near:     Physical Exam Vitals and nursing note reviewed.  Constitutional:      General: He is not in acute distress.    Appearance: He is well-developed.  HENT:     Head: Normocephalic and atraumatic.  Eyes:     Conjunctiva/sclera: Conjunctivae normal.  Cardiovascular:     Rate and Rhythm: Normal rate and regular rhythm.     Heart sounds: No murmur heard. Pulmonary:     Effort: Pulmonary effort is normal. No respiratory distress.     Breath sounds: Normal breath sounds.  Abdominal:     Palpations: Abdomen is soft.     Tenderness: There is no abdominal tenderness.  Musculoskeletal:        General: No swelling.     Cervical back: Neck supple.  Skin:    General: Skin is warm and dry.     Capillary Refill: Capillary refill takes less than 2 seconds.     Comments: Left hand on the dorsal aspect with 3-4 puncture wounds around the webbing between the thumb and index finger, mild erythema, no purulence, no fluctuance  Neurological:     Mental Status: He is alert.  Psychiatric:        Mood and Affect: Mood normal.      UC Treatments / Results  Labs (all labs ordered are listed, but only abnormal results are displayed) Labs Reviewed - No data to display  EKG   Radiology No results found.  Procedures Procedures (including critical care time)  Medications Ordered in UC Medications  Tdap (ADACEL) injection 0.5 mL (has no administration in time range)    Initial Impression / Assessment and Plan / UC Course  I have reviewed the triage vital signs and the nursing notes.  Pertinent labs & imaging results that were available during my care of the patient  were reviewed by me and considered in my medical decision making (see chart for details).     Need for prophylactic vaccination with combined  diphtheria-tetanus-pertussis (DTP) vaccine  Cat bite of left hand with infection, initial encounter  Cat bite to the left hand by updated personal cat.  Tetanus is out of date therefore we have updated this today.  Currently on antibiotics for dental infection and would continue these antibiotics for total of 7 days for the cat bite.  Monitor your symptoms.  If there is increased pain, increased redness, fevers, purulent drainage or other concerning symptoms after being on antibiotics then recommend returning to urgent care  Final Clinical Impressions(s) / UC Diagnoses   Final diagnoses:  Need for prophylactic vaccination with combined diphtheria-tetanus-pertussis (DTP) vaccine  Cat bite of left hand with infection, initial encounter     Discharge Instructions      Cat bite to the left hand by updated personal cat.  Tetanus is out of date therefore we have updated this today.  Currently on antibiotics for dental infection and would continue these antibiotics for total of 7 days for the cat bite.  Monitor your symptoms.  If there is increased pain, increased redness, fevers, purulent drainage or other concerning symptoms after being on antibiotics then recommend returning to urgent care    ED Prescriptions   None    PDMP not reviewed this encounter.   Teresa Almarie LABOR, [place] 01/01/24 1802
